# Patient Record
Sex: Female | Born: 1960
Health system: Southern US, Community
[De-identification: ages and names within clinical notes are randomized; demographics above are authoritative.]

## PROBLEM LIST (undated history)

## (undated) DIAGNOSIS — B009 Herpesviral infection, unspecified: Secondary | ICD-10-CM

## (undated) DIAGNOSIS — R7989 Other specified abnormal findings of blood chemistry: Secondary | ICD-10-CM

## (undated) DIAGNOSIS — D369 Benign neoplasm, unspecified site: Secondary | ICD-10-CM

## (undated) DIAGNOSIS — E229 Hyperfunction of pituitary gland, unspecified: Secondary | ICD-10-CM

## (undated) DIAGNOSIS — I10 Essential (primary) hypertension: Secondary | ICD-10-CM

## (undated) DIAGNOSIS — D352 Benign neoplasm of pituitary gland: Secondary | ICD-10-CM

## (undated) HISTORY — DX: Other specified abnormal findings of blood chemistry: R79.89

## (undated) HISTORY — DX: Essential (primary) hypertension: I10

## (undated) HISTORY — DX: Benign neoplasm, unspecified site: D36.9

## (undated) HISTORY — DX: Herpesviral infection, unspecified: B00.9

## (undated) HISTORY — PX: TUBAL LIGATION: SHX77

## (undated) HISTORY — DX: Benign neoplasm of pituitary gland: D35.2

## (undated) HISTORY — DX: Hyperfunction of pituitary gland, unspecified: E22.9

---

## 2017-02-28 DIAGNOSIS — Z23 Encounter for immunization: Secondary | ICD-10-CM | POA: Diagnosis not present

## 2017-04-18 DIAGNOSIS — Z136 Encounter for screening for cardiovascular disorders: Secondary | ICD-10-CM | POA: Diagnosis not present

## 2017-04-18 DIAGNOSIS — Z0000 Encounter for general adult medical examination without abnormal findings: Secondary | ICD-10-CM | POA: Diagnosis not present

## 2017-04-18 DIAGNOSIS — E229 Hyperfunction of pituitary gland, unspecified: Secondary | ICD-10-CM | POA: Diagnosis not present

## 2017-04-18 DIAGNOSIS — Z1389 Encounter for screening for other disorder: Secondary | ICD-10-CM | POA: Diagnosis not present

## 2017-06-01 DIAGNOSIS — Z1211 Encounter for screening for malignant neoplasm of colon: Secondary | ICD-10-CM | POA: Diagnosis not present

## 2017-06-01 DIAGNOSIS — Z01818 Encounter for other preprocedural examination: Secondary | ICD-10-CM | POA: Diagnosis not present

## 2017-06-26 DIAGNOSIS — E876 Hypokalemia: Secondary | ICD-10-CM | POA: Diagnosis not present

## 2017-06-26 DIAGNOSIS — I10 Essential (primary) hypertension: Secondary | ICD-10-CM | POA: Diagnosis not present

## 2017-07-12 DIAGNOSIS — I10 Essential (primary) hypertension: Secondary | ICD-10-CM | POA: Diagnosis not present

## 2017-07-12 DIAGNOSIS — R000 Tachycardia, unspecified: Secondary | ICD-10-CM | POA: Diagnosis not present

## 2017-07-25 DIAGNOSIS — R000 Tachycardia, unspecified: Secondary | ICD-10-CM | POA: Diagnosis not present

## 2017-07-25 DIAGNOSIS — I10 Essential (primary) hypertension: Secondary | ICD-10-CM | POA: Diagnosis not present

## 2017-07-26 ENCOUNTER — Other Ambulatory Visit: Payer: Self-pay | Admitting: Internal Medicine

## 2017-07-26 DIAGNOSIS — R000 Tachycardia, unspecified: Secondary | ICD-10-CM

## 2017-07-26 DIAGNOSIS — I10 Essential (primary) hypertension: Secondary | ICD-10-CM

## 2017-08-09 ENCOUNTER — Other Ambulatory Visit: Payer: Self-pay

## 2017-08-09 ENCOUNTER — Ambulatory Visit (HOSPITAL_COMMUNITY): Payer: BLUE CROSS/BLUE SHIELD | Attending: Internal Medicine

## 2017-08-09 ENCOUNTER — Ambulatory Visit (INDEPENDENT_AMBULATORY_CARE_PROVIDER_SITE_OTHER): Payer: BLUE CROSS/BLUE SHIELD

## 2017-08-09 DIAGNOSIS — R000 Tachycardia, unspecified: Secondary | ICD-10-CM | POA: Diagnosis not present

## 2017-08-09 DIAGNOSIS — I10 Essential (primary) hypertension: Secondary | ICD-10-CM

## 2017-08-09 DIAGNOSIS — I5030 Unspecified diastolic (congestive) heart failure: Secondary | ICD-10-CM | POA: Diagnosis not present

## 2017-08-10 LAB — EXERCISE TOLERANCE TEST
Estimated workload: 11 METS
Exercise duration (min): 9 min
Exercise duration (sec): 33 s
MPHR: 162 {beats}/min
Peak HR: 164 {beats}/min
Percent HR: 101 %
RPE: 17
Rest HR: 73 {beats}/min

## 2017-08-24 DIAGNOSIS — I10 Essential (primary) hypertension: Secondary | ICD-10-CM | POA: Diagnosis not present

## 2017-08-24 DIAGNOSIS — R9439 Abnormal result of other cardiovascular function study: Secondary | ICD-10-CM | POA: Diagnosis not present

## 2017-08-24 DIAGNOSIS — I5189 Other ill-defined heart diseases: Secondary | ICD-10-CM | POA: Diagnosis not present

## 2017-09-14 ENCOUNTER — Telehealth: Payer: Self-pay | Admitting: Cardiovascular Disease

## 2017-09-14 ENCOUNTER — Encounter: Payer: Self-pay | Admitting: Cardiovascular Disease

## 2017-09-14 ENCOUNTER — Ambulatory Visit (INDEPENDENT_AMBULATORY_CARE_PROVIDER_SITE_OTHER): Payer: BLUE CROSS/BLUE SHIELD | Admitting: Cardiovascular Disease

## 2017-09-14 VITALS — BP 137/83 | HR 66 | Ht 60.0 in | Wt 149.0 lb

## 2017-09-14 DIAGNOSIS — R0789 Other chest pain: Secondary | ICD-10-CM

## 2017-09-14 DIAGNOSIS — R002 Palpitations: Secondary | ICD-10-CM

## 2017-09-14 DIAGNOSIS — I5189 Other ill-defined heart diseases: Secondary | ICD-10-CM | POA: Insufficient documentation

## 2017-09-14 DIAGNOSIS — I10 Essential (primary) hypertension: Secondary | ICD-10-CM | POA: Insufficient documentation

## 2017-09-14 DIAGNOSIS — E785 Hyperlipidemia, unspecified: Secondary | ICD-10-CM | POA: Insufficient documentation

## 2017-09-14 NOTE — Telephone Encounter (Signed)
New Message:       Pt calling to see if her medication list was sent over with her referral. If not she needs enough time to get it before her appt today

## 2017-09-14 NOTE — Telephone Encounter (Signed)
Informed pt that we have not yet received records from PCP. Pt states she will be calling office to have it faxed.

## 2017-09-14 NOTE — Assessment & Plan Note (Signed)
History of hyperlipidemia not on statin therapy. 

## 2017-09-14 NOTE — Progress Notes (Signed)
09/14/2017 Dawn Duffy   February 08, 1961  [id number]  Primary Physician Dawn Huddle, MD Primary Cardiologist: Lorretta Harp MD [name], [place]  HPI:  Dawn Duffy is a 57 y.o. 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 Inda Merlin for cardiac evaluation because of chest pain, palpitations and a recent GXT that was positive. Her only risk factor includes true hypertension which is fairly new over the last several months. She is under significant amount of stress at home however. She also has palpitations which began back in the first year along with occasional chest pressure and left upper extremity radiation. Recent 2-D echo showed normal LV function with grade 1 diastolic dysfunction GXT showed 1-2 mm of lateral ST segment depression.  Current Meds  Medication Sig  . Cetirizine HCl (ZYRTEC ALLERGY) 10 MG CAPS Take 1 tablet by mouth as directed.  . Cholecalciferol (VITAMIN D3) 1000 units CAPS Take 1 capsule by mouth daily.  . metoprolol succinate (TOPROL-XL) 50 MG 24 hr tablet Take 50 mg by mouth daily.  Marland Kitchen triamterene-hydrochlorothiazide (MAXZIDE-25) 37.5-25 MG tablet Take by mouth daily. Pt takes a half tablet  . valACYclovir (VALTREX) 1000 MG tablet Take 1 tablet by mouth as directed.     Allergies  Allergen Reactions  . Amoxicillin-Pot Clavulanate Nausea Only  . Phenothiazines   . Sulfa Antibiotics     Social History   Socioeconomic History  . Marital status: Married    Spouse name: Not on file  . Number of children: Not on file  . Years of education: Not on file  . Highest education level: Not on file  Occupational History  . Not on file  Social Needs  . Financial resource strain: Not on file  . Food insecurity:    Worry: Not on file    Inability: Not on file  . Transportation needs:    Medical: Not on file   Non-medical: Not on file  Tobacco Use  . Smoking status: Never Smoker  . Smokeless tobacco: Never Used  Substance and Sexual Activity  . Alcohol use: Not on file  . Drug use: Not on file  . Sexual activity: Not on file  Lifestyle  . Physical activity:    Days per week: Not on file    Minutes per session: Not on file  . Stress: Not on file  Relationships  . Social connections:    Talks on phone: Not on file    Gets together: Not on file    Attends religious service: Not on file    Active member of club or organization: Not on file    Attends meetings of clubs or organizations: Not on file    Relationship status: Not on file  . Intimate partner violence:    Fear of current or ex partner: Not on file    Emotionally abused: Not on file    Physically abused: Not on file    Forced sexual activity: Not on file  Other Topics Concern  . Not on file  Social History Narrative  . Not on file     Review of Systems: General: negative for chills, fever, night sweats or weight changes.  Cardiovascular: negative for chest pain, dyspnea on exertion, edema, orthopnea, palpitations, paroxysmal nocturnal dyspnea or shortness of breath Dermatological: negative for rash Respiratory: negative for cough or wheezing Urologic: negative for hematuria  Abdominal: negative for nausea, vomiting, diarrhea, bright red blood per rectum, melena, or hematemesis Neurologic: negative for visual changes, syncope, or dizziness All other systems reviewed and are otherwise negative except as noted above.    Blood pressure 137/83, pulse 66, height 5' (1.524 m), weight 149 lb (67.6 kg), SpO2 98 %.  General appearance: alert and no distress Neck: no adenopathy, no carotid bruit, no JVD, supple, symmetrical, trachea midline and thyroid not enlarged, symmetric, no tenderness/mass/nodules Lungs: clear to auscultation bilaterally Heart: regular rate and rhythm, S1, S2 normal, no murmur, click, rub or  gallop Extremities: extremities normal, atraumatic, no cyanosis or edema Pulses: 2+ and symmetric Skin: Skin color, texture, turgor normal. No rashes or lesions Neurologic: Alert and oriented X 3, normal strength and tone. Normal symmetric reflexes. Normal coordination and gait  EKG not performed today  ASSESSMENT AND PLAN:   Essential hypertension History of essential hypertension blood pressure measured at 137/83. She is on Maxzide and metoprolol.  Hyperlipidemia History of hyperlipidemia not on statin therapy  Palpitations History palpitations or last several months occurring on a weekly basis. We will check a 2 week event monitor.  Atypical chest pain History of atypical chest pain with occasional left upper extremity. She had a recent GXT that showed 1-2 mm of lateral ST segment depression.I'm going to get a coronary CTA to rule out CAD given the fact that this could be a falsely positive test.      Lorretta Harp MD [facility], [facility] 09/14/2017 4:45 PM

## 2017-09-14 NOTE — Patient Instructions (Signed)
Medication Instructions: Your physician recommends that you continue on your current medications as directed. Please refer to the Current Medication list given to you today.  Labwork: Your physician recommends that you return for lab work within 1 week prior to CTA.   Testing/Procedures:  Your physician has recommended that you wear a 14 day event monitor. Event monitors are medical devices that record the heart's electrical activity. Doctors most often [place] these monitors to diagnose arrhythmias. Arrhythmias are problems with the speed or rhythm of the heartbeat. The monitor is a small, portable device. You can wear one while you do your normal daily activities. This is usually used to diagnose what is causing palpitations/syncope (passing out).   Coronary CTA:   Please arrive at the [facility] main entrance of [facility] at                AM (30-45 minutes prior to test start time)  [facility] [place], [place] [address] [phone]  Proceed to the [facility] Radiology Department (First Floor).  Please follow these instructions carefully (unless otherwise directed):  Hold all erectile dysfunction medications at least 48 hours prior to test.  On the Night Before the Test: . Drink plenty of water. . Do not consume any caffeinated/decaffeinated beverages or chocolate 12 hours prior to your test. . Do not take any antihistamines 12 hours prior to your test.  On the Day of the Test: . Drink plenty of water. Do not drink any water within one hour of the test. . Do not eat any food 4 hours prior to the test. . You may take your regular medications prior to the test. .  After the Test: . Drink plenty of water. . After receiving IV contrast, you may experience a mild flushed feeling. This is normal. . On occasion, you may experience a mild rash up to 24 hours after the test. This is not dangerous. If this occurs, you can take Benadryl 25 mg and  increase your fluid intake. . If you experience trouble breathing, this can be serious. If it is severe call 911 IMMEDIATELY. If it is mild, please call our office. . If you take any of these medications: Glipizide/Metformin, Avandament, Glucavance, please do not take 48 hours after completing test.   Follow-Up: Your physician recommends that you schedule a follow-up appointment with Dr. Gwenlyn Found after testing.

## 2017-09-14 NOTE — Assessment & Plan Note (Signed)
History of essential hypertension blood pressure measured at 137/83. She is on Maxzide and metoprolol.

## 2017-09-14 NOTE — Assessment & Plan Note (Signed)
History palpitations or last several months occurring on a weekly basis. We will check a 2 week event monitor.

## 2017-09-14 NOTE — Assessment & Plan Note (Signed)
History of atypical chest pain with occasional left upper extremity. She had a recent GXT that showed 1-2 mm of lateral ST segment depression.I'm going to get a coronary CTA to rule out CAD given the fact that this could be a falsely positive test.

## 2017-09-19 ENCOUNTER — Ambulatory Visit: Payer: BLUE CROSS/BLUE SHIELD | Admitting: Cardiovascular Disease

## 2017-11-30 DIAGNOSIS — L089 Local infection of the skin and subcutaneous tissue, unspecified: Secondary | ICD-10-CM | POA: Diagnosis not present

## 2017-11-30 DIAGNOSIS — S60429A Blister (nonthermal) of unspecified finger, initial encounter: Secondary | ICD-10-CM | POA: Diagnosis not present

## 2017-11-30 DIAGNOSIS — B001 Herpesviral vesicular dermatitis: Secondary | ICD-10-CM | POA: Diagnosis not present

## 2017-12-15 ENCOUNTER — Encounter: Payer: Self-pay | Admitting: Obstetrics and Gynecology

## 2017-12-15 DIAGNOSIS — Z1231 Encounter for screening mammogram for malignant neoplasm of breast: Secondary | ICD-10-CM | POA: Diagnosis not present

## 2018-01-05 ENCOUNTER — Encounter: Payer: Self-pay | Admitting: Obstetrics and Gynecology

## 2018-01-10 ENCOUNTER — Ambulatory Visit (HOSPITAL_COMMUNITY): Payer: BLUE CROSS/BLUE SHIELD

## 2018-01-30 ENCOUNTER — Ambulatory Visit (HOSPITAL_COMMUNITY): Payer: BLUE CROSS/BLUE SHIELD

## 2018-02-20 ENCOUNTER — Encounter: Payer: BLUE CROSS/BLUE SHIELD | Admitting: *Deleted

## 2018-02-20 ENCOUNTER — Telehealth: Payer: Self-pay | Admitting: *Deleted

## 2018-02-20 ENCOUNTER — Ambulatory Visit (HOSPITAL_COMMUNITY)
Admission: RE | Admit: 2018-02-20 | Discharge: 2018-02-20 | Disposition: A | Discharge status: Home / Self Care | Payer: BLUE CROSS/BLUE SHIELD | Source: Ambulatory Visit | Attending: Cardiovascular Disease | Admitting: Cardiovascular Disease

## 2018-02-20 DIAGNOSIS — Z006 Encounter for examination for normal comparison and control in clinical research program: Secondary | ICD-10-CM

## 2018-02-20 DIAGNOSIS — R0789 Other chest pain: Secondary | ICD-10-CM | POA: Insufficient documentation

## 2018-02-20 DIAGNOSIS — R002 Palpitations: Secondary | ICD-10-CM | POA: Insufficient documentation

## 2018-02-20 DIAGNOSIS — R079 Chest pain, unspecified: Secondary | ICD-10-CM | POA: Diagnosis not present

## 2018-02-20 MED ORDER — METOPROLOL TARTRATE 5 MG/5ML IV SOLN
INTRAVENOUS | Status: AC
  Filled 2018-02-20: qty 15

## 2018-02-20 MED ORDER — IOPAMIDOL (ISOVUE-370) INJECTION 76%
100.0000 mL | Freq: Once | INTRAVENOUS | Status: AC | PRN
  Administered 2018-02-20: 80 mL via INTRAVENOUS

## 2018-02-20 MED ORDER — METOPROLOL TARTRATE 5 MG/5ML IV SOLN
5.0000 mg | INTRAVENOUS | Status: DC | PRN
  Administered 2018-02-20 (×3): 5 mg via INTRAVENOUS
  Filled 2018-02-20: qty 5

## 2018-02-20 MED ORDER — NITROGLYCERIN 0.4 MG SL SUBL
0.8000 mg | SUBLINGUAL_TABLET | Freq: Once | SUBLINGUAL | Status: AC
  Administered 2018-02-20: 0.8 mg via SUBLINGUAL
  Filled 2018-02-20: qty 25

## 2018-02-20 MED ORDER — NITROGLYCERIN 0.4 MG SL SUBL
SUBLINGUAL_TABLET | SUBLINGUAL | Status: AC
  Filled 2018-02-20: qty 2

## 2018-02-20 NOTE — Progress Notes (Signed)
CT scan completed. Tolerated well. D/C home walking, awake and alert. In no distress. 

## 2018-02-20 NOTE — Telephone Encounter (Signed)
Advised patient of CT results   Patient would like to hold off on her monitor for now stated she is doing better. Her episodes of feeling like her heart is racing much better and not even having them monthly. States she notices when she has a lot of salt or gets anxious. Advised ok to hold off for now since they are so infrequent but to call back if episodes increase. Advised would make Dr Gwenlyn Found aware and call back if further recommendations.

## 2018-02-20 NOTE — Telephone Encounter (Signed)
-----   Message from Lorretta Harp, MD sent at 02/20/2018  1:36 PM EDT ----- Coronary calcium score of 0.  Normal coronary CTA.  No evidence of CAD.

## 2018-02-20 NOTE — Telephone Encounter (Signed)
-----   Message from Lorretta Harp, MD sent at 02/20/2018  1:28 PM EDT ----- No significant extracardiac abnormality on chest CT

## 2018-02-20 NOTE — Research (Signed)
CADFEM Informed Consent           Subject Name:  Dawn Duffy   Subject met inclusion and exclusion criteria.  The informed consent form, study requirements and expectations were reviewed with the subject and questions and concerns were addressed prior to the signing of the consent form.  The subject verbalized understanding of the trial requirements.  The subject agreed to participate in the CADFEM trial and signed the informed consent.  The informed consent was obtained prior to performance of any protocol-specific procedures for the subject.  A copy of the signed informed consent was given to the subject and a copy was placed in the subject's medical record.   [place] Chalmers, Research Assistant 02/20/2018 7:50

## 2018-02-21 NOTE — Research (Signed)
CadFem visit completed.

## 2018-03-05 ENCOUNTER — Other Ambulatory Visit (HOSPITAL_COMMUNITY)
Admission: RE | Admit: 2018-03-05 | Discharge: 2018-03-05 | Disposition: A | Discharge status: Home / Self Care | Payer: BLUE CROSS/BLUE SHIELD | Source: Ambulatory Visit | Attending: Obstetrics and Gynecology | Admitting: Obstetrics and Gynecology

## 2018-03-05 ENCOUNTER — Encounter: Payer: Self-pay | Admitting: Obstetrics and Gynecology

## 2018-03-05 ENCOUNTER — Ambulatory Visit: Payer: BLUE CROSS/BLUE SHIELD | Admitting: Obstetrics and Gynecology

## 2018-03-05 ENCOUNTER — Other Ambulatory Visit: Payer: Self-pay

## 2018-03-05 VITALS — BP 130/78 | HR 72 | Ht 60.63 in | Wt 153.6 lb

## 2018-03-05 DIAGNOSIS — Z01419 Encounter for gynecological examination (general) (routine) without abnormal findings: Secondary | ICD-10-CM

## 2018-03-05 DIAGNOSIS — D352 Benign neoplasm of pituitary gland: Secondary | ICD-10-CM | POA: Diagnosis not present

## 2018-03-05 MED ORDER — VALACYCLOVIR HCL 1 G PO TABS: ORAL_TABLET | ORAL | 3 refills | Status: DC

## 2018-03-05 NOTE — Patient Instructions (Signed)

## 2018-03-05 NOTE — Progress Notes (Signed)
57 y.o. G56P2001 Married Caucasian female here for annual exam.    Lots of transition with moves due to her husband's change in work.   PCP is doing blood work and following her prolactin level.  It was normal with the last check.  States it has been normal for a really long time.  It was elevated during her fertility time.  Had an MRI and has a microadenoma.  She will do her prolactin levels here.   Wants a refill of a cream for anal itching.  She does not remember the name and will call back with this information.   Works for [employer].   PCP:   Josetta Huddle, M.D.  No LMP recorded. Patient is postmenopausal.           Sexually active: Yes.    The current method of family planning is post menopausal status.    Exercising: Yes.    walking Smoker:  no  Health Maintenance: Pap:  2016 normal per patient History of abnormal Pap:  no MMG:  01/2018 normal per patient, done at [facility] BMD:  Many years ago and was normal.  Colonoscopy:  05/2005 WNL.  Had an appt and had to cancel.  She will reschedule with Dr. Watt Climes.  TDaP:  02/18/2010 HIV: Never Hep C: Never Screening Labs: does with PCP   reports that she has never smoked. She has never used smokeless tobacco. She reports that she drinks about 6.0 standard drinks of alcohol per week. She reports that she does not use drugs.  Past Medical History:  Diagnosis Date  . Elevated prolactin level ([place])   . HSV-1 infection   . Hypertension   . Microadenoma [facility])     Past Surgical History:  Procedure Laterality Date  . TUBAL LIGATION      Current Outpatient Medications  Medication Sig Dispense Refill  . Cetirizine HCl (ZYRTEC ALLERGY) 10 MG CAPS Take 1 tablet by mouth as directed.    . Cholecalciferol (VITAMIN D3) 1000 units CAPS Take 1 capsule by mouth daily.    . metoprolol succinate (TOPROL-XL) 50 MG 24 hr tablet Take 50 mg by mouth daily.  3  . triamterene-hydrochlorothiazide (MAXZIDE-25) 37.5-25 MG tablet Take by  mouth daily. Pt takes a half tablet  3  . valACYclovir (VALTREX) 1000 MG tablet Take 1 tablet by mouth as directed.     No current facility-administered medications for this visit.     Family History  Problem Relation Age of Onset  . Stroke Mother   . Prostate cancer Father   . Diabetes Sister   . Kidney cancer Paternal Grandmother     Review of Systems  Constitutional: Negative.   HENT: Negative.   Eyes: Negative.   Respiratory: Negative.   Cardiovascular: Negative.   Gastrointestinal: Negative.   Endocrine: Negative.   Genitourinary: Negative.   Musculoskeletal: Negative.   Skin: Negative.   Allergic/Immunologic: Negative.   Neurological: Negative.   Hematological: Negative.   Psychiatric/Behavioral: Negative.     Exam:   BP 130/78 (BP Location: Right Arm, Patient Position: Sitting, Cuff Size: Normal)   Pulse 72   Ht 5' 0.63" (1.54 m)   Wt 153 lb 9.6 oz (69.7 kg)   BMI 29.38 kg/m     General appearance: alert, cooperative and appears stated age Head: Normocephalic, without obvious abnormality, atraumatic Neck: no adenopathy, supple, symmetrical, trachea midline and thyroid normal to inspection and palpation Lungs: clear to auscultation bilaterally Breasts: normal appearance, no masses or tenderness,  No nipple retraction or dimpling, No nipple discharge or bleeding, No axillary or supraclavicular adenopathy Heart: regular rate and rhythm Abdomen: soft, non-tender; no masses, no organomegaly Extremities: extremities normal, atraumatic, no cyanosis or edema Skin: Skin color, texture, turgor normal. No rashes or lesions Lymph nodes: Cervical, supraclavicular, and axillary nodes normal. No abnormal inguinal nodes palpated Neurologic: Grossly normal  Pelvic: External genitalia:  no lesions              Urethra:  normal appearing urethra with no masses, tenderness or lesions              Bartholins and Skenes: normal                 Vagina: normal appearing vagina  with normal color and discharge, no lesions              Cervix: no lesions              Pap taken: Yes.   Bimanual Exam:  Uterus:  normal size, contour, position, consistency, mobility, non-tender              Adnexa: no mass, fullness, tenderness              Rectal exam: Yes.  .  Confirms.              Anus:  normal sphincter tone, no lesions  Chaperone was present for exam.  Assessment:   Well woman visit with normal exam. Elevated prolactin and microadenoma.  HTN.   Plan: Mammogram screening. Recommended self breast awareness. Pap and HR HPV as above. Guidelines for Calcium, Vitamin D, regular exercise program including cardiovascular and weight bearing exercise. Prolactin level yearly.  Follow up annually and prn.    After visit summary provided.

## 2018-03-06 ENCOUNTER — Telehealth: Payer: Self-pay | Admitting: Obstetrics and Gynecology

## 2018-03-06 LAB — PROLACTIN: Prolactin: 6.2 ng/mL (ref 4.8–23.3)

## 2018-03-06 MED ORDER — CLOTRIMAZOLE-BETAMETHASONE 1-0.05 % EX CREA: 1.0000 "application " | TOPICAL_CREAM | Freq: Every day | CUTANEOUS | 0 refills | Status: DC | PRN

## 2018-03-06 NOTE — Telephone Encounter (Signed)
Rx to pharmacy, patient notified. Encounter closed.

## 2018-03-06 NOTE — Telephone Encounter (Signed)
[place] for refill one tube.

## 2018-03-06 NOTE — Telephone Encounter (Signed)
Patient stated she was seen yesterday and told to call with the name of a new medication she'd like to take: Clotrimazole and betamethasone ditropionate cream 1%/0.05%.  [facility] on [address] and [address]

## 2018-03-06 NOTE — Telephone Encounter (Signed)
Call returned to patient. Confirmed medication, patient states she applies topically daily PRN. States she discussed with Dr. Quincy Simmonds at [facility] 03/05/18. Advised will review with Dr. Quincy Simmonds and return call. Pharmacy confirmed.   Rx pended for Lotrisone cream.   Routing to Dr. Quincy Simmonds

## 2018-03-07 ENCOUNTER — Telehealth: Payer: Self-pay | Admitting: *Deleted

## 2018-03-07 LAB — CYTOLOGY - PAP
Diagnosis: NEGATIVE
HPV: NOT DETECTED

## 2018-03-07 NOTE — Telephone Encounter (Signed)
Message left to return call to Dawn Duffy at [phone].    

## 2018-03-07 NOTE — Telephone Encounter (Signed)
Patient returned call. Notified of results and verbalized understanding.   Encounter closed.

## 2018-03-07 NOTE — Telephone Encounter (Signed)
-----   Message from Nunzio Cobbs, MD sent at 03/06/2018  3:01 PM EDT ----- Please contact patient with results of prolactin which is normal.  This can be tested here yearly.   I am highlighting this result so you know to contact the patient.

## 2018-03-09 DIAGNOSIS — Z23 Encounter for immunization: Secondary | ICD-10-CM | POA: Diagnosis not present

## 2018-03-23 ENCOUNTER — Encounter: Payer: Self-pay | Admitting: Cardiovascular Disease

## 2018-04-27 DIAGNOSIS — M25571 Pain in right ankle and joints of right foot: Secondary | ICD-10-CM | POA: Diagnosis not present

## 2018-06-29 DIAGNOSIS — B078 Other viral warts: Secondary | ICD-10-CM | POA: Diagnosis not present

## 2018-06-29 DIAGNOSIS — L400 Psoriasis vulgaris: Secondary | ICD-10-CM | POA: Diagnosis not present

## 2018-06-29 DIAGNOSIS — D2271 Melanocytic nevi of right lower limb, including hip: Secondary | ICD-10-CM | POA: Diagnosis not present

## 2018-06-29 DIAGNOSIS — D2261 Melanocytic nevi of right upper limb, including shoulder: Secondary | ICD-10-CM | POA: Diagnosis not present

## 2018-06-29 DIAGNOSIS — D225 Melanocytic nevi of trunk: Secondary | ICD-10-CM | POA: Diagnosis not present

## 2018-06-29 DIAGNOSIS — D485 Neoplasm of uncertain behavior of skin: Secondary | ICD-10-CM | POA: Diagnosis not present

## 2018-07-02 DIAGNOSIS — D2239 Melanocytic nevi of other parts of face: Secondary | ICD-10-CM | POA: Diagnosis not present

## 2018-08-23 ENCOUNTER — Telehealth: Payer: Self-pay | Admitting: Obstetrics and Gynecology

## 2018-08-23 NOTE — Telephone Encounter (Signed)
Patient says she is leaking urine.

## 2018-08-23 NOTE — Telephone Encounter (Signed)
I would be fine with her coming in. I think you should contact Dr Quincy Simmonds to see if she wants to talk to her or have her come in. She could try OTC AZO to determine if it really is urine she is leaking.

## 2018-08-23 NOTE — Telephone Encounter (Signed)
Spoke with patient. Patient states that over the last few days sh has noticed that her underwear is intermittently wet. Does not feel this is discharge. Feels she is completely emptying her bladder, but has pressure after using the restroom intermittently. Denies any pain with using the restroom. Over the last few days she has had leakage after emptying her bladder. This is better when laying down. Patient is concerned with symptoms and with coming to the office during [place] state of emergency. Advised patient symptoms may be related to bladder prolapse. Patient wants me to review scheduling and return call.  Dr.Jertson, okay to schedule with Dr.Silva in office?

## 2018-08-23 NOTE — Telephone Encounter (Signed)
Patients symptoms may be due to bladder infection.  I am happy to see her tomorrow for a brief visit and a urine check for infection.  It would be better for her not to take the AZO before being seen.  If she wishes to avoid an office visit, then take the AZO to see if she is leaking urine.   If develops urgency, pain with urination, back pain, fever, or nausea, likely needs evaluation for urinary tract infection.

## 2018-08-23 NOTE — Telephone Encounter (Signed)
Spoke with patient. Advised of message as seen below from [place]. Patient verbalizes understanding. States that she is unable to go out today for AZO but will tomorrow. Advised will review with Dr.Silva and she will receive a return call with further recommendations.

## 2018-08-23 NOTE — Telephone Encounter (Signed)
Spoke with patient. Appointment scheduled for 4/10 at 1 pm with Dr.Silva. Patient declines morning appointment. Advised if she develops any urgency, pain with urination, back pain, fever, or nausea needs to be seen for evaluation of UTI with urgent care before tomorrow. Encounter closed.

## 2018-08-24 ENCOUNTER — Ambulatory Visit: Payer: BLUE CROSS/BLUE SHIELD | Admitting: Obstetrics and Gynecology

## 2018-08-24 ENCOUNTER — Other Ambulatory Visit: Payer: Self-pay

## 2018-08-24 ENCOUNTER — Encounter (INDEPENDENT_AMBULATORY_CARE_PROVIDER_SITE_OTHER): Payer: Self-pay

## 2018-08-24 ENCOUNTER — Encounter: Payer: Self-pay | Admitting: Obstetrics and Gynecology

## 2018-08-24 VITALS — BP 120/70 | HR 68 | Temp 97.4°F | Resp 16 | Wt 157.0 lb

## 2018-08-24 DIAGNOSIS — R32 Unspecified urinary incontinence: Secondary | ICD-10-CM

## 2018-08-24 DIAGNOSIS — N3281 Overactive bladder: Secondary | ICD-10-CM

## 2018-08-24 LAB — POCT URINALYSIS DIPSTICK
Bilirubin, UA: NEGATIVE
Blood, UA: NEGATIVE
Glucose, UA: NEGATIVE
Ketones, UA: NEGATIVE
Leukocytes, UA: NEGATIVE
Nitrite, UA: NEGATIVE
Protein, UA: NEGATIVE
Urobilinogen, UA: NEGATIVE E.U./dL — AB
pH, UA: 5 (ref 5.0–8.0)

## 2018-08-24 NOTE — Patient Instructions (Signed)

## 2018-08-24 NOTE — Progress Notes (Signed)
GYNECOLOGY  VISIT   HPI: 58 y.o.   Married  Caucasian  female   G2P2001 with No LMP recorded. Patient is postmenopausal.   here for urinary incontinence.   Thinks she is leaking urine.  Symptoms started 6 days ago, but thinks it is more prominent just recently.  She is not sure if it is vaginal discharge or not.   She does have a history of urgency, but this feels different.  (She reports key in lock syndrome.) She is feeling pressure in her pelvis, but not for the last day or so.  Denies dysuria or blood in the urine.  Now feels like she may not be finishing her voiding completely.  She is double voiding.   She is now wearing Poise pads and cannot tell what is going on.   No leak with cough, laugh or sneeze.   DF - every hour.  NF - once per night. No enuresis.   No vaginal itching, burning, or odor.  No change in how sex feels per patient.  No change in partner.   Drinks tea during the day.  One to two per day.   Takes Maxide for 1.5 years.   GYNECOLOGIC HISTORY: No LMP recorded. Patient is postmenopausal. Contraception:  Post menopausal Menopausal hormone therapy:  none Last mammogram:  9/19 neg per patient Last pap smear:   03-05-18 neg HPV HR neg poct urine-neg        OB History    Gravida  2   Para  2   Term  2   Preterm  0   AB  0   Living  1     SAB      TAB      Ectopic      Multiple      Live Births  1              Patient Active Problem List   Diagnosis Date Noted  . Essential hypertension 09/14/2017  . Palpitations 09/14/2017  . Atypical chest pain 09/14/2017  . Diastolic dysfunction 54/56/2563  . Hyperlipidemia 09/14/2017    Past Medical History:  Diagnosis Date  . Elevated prolactin level ([place])   . HSV-1 infection   . Hypertension   . Microadenoma [facility])     Past Surgical History:  Procedure Laterality Date  . CESAREAN SECTION     x 2.   . TUBAL LIGATION      Current Outpatient Medications  Medication Sig  Dispense Refill  . Cetirizine HCl (ZYRTEC ALLERGY) 10 MG CAPS Take 1 tablet by mouth as directed.    . Cholecalciferol (VITAMIN D3) 1000 units CAPS Take 1 capsule by mouth daily.    . metoprolol succinate (TOPROL-XL) 50 MG 24 hr tablet Take 50 mg by mouth daily.  3  . triamterene-hydrochlorothiazide (MAXZIDE-25) 37.5-25 MG tablet Take by mouth daily. Pt takes a half tablet  3  . valACYclovir (VALTREX) 1000 MG tablet Take 2 tablets (2000 mg) by mouth every 12 hours for 24 hours. 20 tablet 3   No current facility-administered medications for this visit.      ALLERGIES: Amoxicillin-pot clavulanate; Phenothiazines; and Sulfa antibiotics  Family History  Problem Relation Age of Onset  . Stroke Mother   . Prostate cancer Father   . Diabetes Sister   . Kidney cancer Paternal Grandmother     Social History   Socioeconomic History  . Marital status: Married    Spouse name: Not on file  . Number  of children: Not on file  . Years of education: Not on file  . Highest education level: Not on file  Occupational History  . Not on file  Social Needs  . Financial resource strain: Not on file  . Food insecurity:    Worry: Not on file    Inability: Not on file  . Transportation needs:    Medical: Not on file    Non-medical: Not on file  Tobacco Use  . Smoking status: Never Smoker  . Smokeless tobacco: Never Used  Substance and Sexual Activity  . Alcohol use: Yes    Alcohol/week: 6.0 standard drinks    Types: 6 Glasses of wine per week  . Drug use: Never  . Sexual activity: Yes    Birth control/protection: Post-menopausal, Surgical    Comment: tubal ligation  Lifestyle  . Physical activity:    Days per week: Not on file    Minutes per session: Not on file  . Stress: Not on file  Relationships  . Social connections:    Talks on phone: Not on file    Gets together: Not on file    Attends religious service: Not on file    Active member of club or organization: Not on file     Attends meetings of clubs or organizations: Not on file    Relationship status: Not on file  . Intimate partner violence:    Fear of current or ex partner: Not on file    Emotionally abused: Not on file    Physically abused: Not on file    Forced sexual activity: Not on file  Other Topics Concern  . Not on file  Social History Narrative  . Not on file    Review of Systems  Constitutional: Negative.   HENT: Negative.   Eyes: Negative.   Respiratory: Negative.   Cardiovascular: Negative.   Gastrointestinal: Negative.   Endocrine: Negative.   Genitourinary:       Urinary incontinence  Musculoskeletal: Negative.   Skin: Negative.   Allergic/Immunologic: Negative.   Neurological: Negative.   Hematological: Negative.   Psychiatric/Behavioral: Negative.     PHYSICAL EXAMINATION:    BP 120/70   Pulse 68   Temp (!) 97.4 F (36.3 C) (Oral)   Resp 16   Wt 157 lb (71.2 kg)   BMI 30.03 kg/m     General appearance: alert, cooperative and appears stated age   Pelvic: External genitalia:  no lesions              Urethra:  normal appearing urethra with no masses, tenderness or lesions              Bartholins and Skenes: normal                 Vagina: normal appearing vagina with normal color and discharge, no lesions              Cervix: no lesions                Bimanual Exam:  Uterus:  normal size, contour, position, consistency, mobility, non-tender              Adnexa: no mass, fullness, tenderness              Rectal exam: Yes.  .  Confirms.              Anus:  normal sphincter tone, no lesions  Chaperone was present for exam.  ASSESSMENT  Overactive bladder.  Pelvic pressure.  No UTI. Hx C/S x 2.   PLAN  We discussed overactive bladder.  She will reduce/eliminate bladder irritants.  She will take AZO to see if she is leaking or not.  If urinary incontinence is confirmed, she will return for a potential Rx for anticholinergic/antimuscarinic.  If pelvic  pressure persists, pelvic US.   An After Visit Summary was printed and given to the patient.  _25_____ minutes face to face time of which over 50% was spent in counseling.

## 2018-11-12 ENCOUNTER — Other Ambulatory Visit: Payer: Self-pay

## 2018-11-12 ENCOUNTER — Telehealth: Payer: Self-pay | Admitting: Obstetrics and Gynecology

## 2018-11-12 ENCOUNTER — Encounter: Payer: Self-pay | Admitting: Obstetrics and Gynecology

## 2018-11-12 ENCOUNTER — Ambulatory Visit: Payer: BC Managed Care – PPO | Admitting: Obstetrics and Gynecology

## 2018-11-12 VITALS — BP 122/72 | HR 76 | Temp 97.5°F | Resp 12 | Ht 60.0 in | Wt 157.0 lb

## 2018-11-12 DIAGNOSIS — N3281 Overactive bladder: Secondary | ICD-10-CM

## 2018-11-12 DIAGNOSIS — R3989 Other symptoms and signs involving the genitourinary system: Secondary | ICD-10-CM

## 2018-11-12 MED ORDER — OXYBUTYNIN CHLORIDE ER 5 MG PO TB24: 5.0000 mg | ORAL_TABLET | Freq: Every day | ORAL | 1 refills | Status: DC

## 2018-11-12 MED ORDER — NITROFURANTOIN MONOHYD MACRO 100 MG PO CAPS: 100.0000 mg | ORAL_CAPSULE | Freq: Two times a day (BID) | ORAL | 0 refills | Status: DC

## 2018-11-12 NOTE — Telephone Encounter (Signed)
Call reviewed with Dr. Quincy Simmonds. Call returned to patient. Advised appt for today has been r/s to earlier time, 3pm. Please return call to office to confirm message received.

## 2018-11-12 NOTE — Telephone Encounter (Signed)
Patient returned call. Patient states she can not come into office any earlier than appt previously scheduled, requesting to keep OV for 4:30pm. Will keep OV as scheduled for 4:30pm with Dr. Quincy Simmonds.  Routing to provider FYI.

## 2018-11-12 NOTE — Progress Notes (Signed)
GYNECOLOGY  VISIT   HPI: 58 y.o.   Married  Caucasian  female   G2P2001 with No LMP recorded. Patient is postmenopausal.   here for OAB and bladder pressure.  Has urinary incontinence.  Wearing Poise and the pad and her underwear is wet.   For the last 3 days is not feeling well.  Has some pressure after voiding.  Not sleeping well.  When she is lying down, it feels better. She is more wet with incontinence with sitting.   Stands and squats to complete her voiding.   No blood in urine.  No nausea or vomiting.  No fever.   No real burning with urination.   She states she is sensitive to medication.   No hx glaucoma.   Patient voided 179mL  Cath for 36 cc.   Urine dip - 1+ RBC.   GYNECOLOGIC HISTORY: No LMP recorded. Patient is postmenopausal. Contraception:  Postmenopausal Menopausal hormone therapy:  none Last mammogram:  9/19 neg per patient Last pap smear:   03-05-18 neg HPV HR neg        OB History    Gravida  2   Para  2   Term  2   Preterm  0   AB  0   Living  1     SAB      TAB      Ectopic      Multiple      Live Births  1              Patient Active Problem List   Diagnosis Date Noted  . Essential hypertension 09/14/2017  . Palpitations 09/14/2017  . Atypical chest pain 09/14/2017  . Diastolic dysfunction 16/02/9603  . Hyperlipidemia 09/14/2017    Past Medical History:  Diagnosis Date  . Elevated prolactin level ([place])   . HSV-1 infection   . Hypertension   . Microadenoma [facility])     Past Surgical History:  Procedure Laterality Date  . CESAREAN SECTION     x 2.   . TUBAL LIGATION      Current Outpatient Medications  Medication Sig Dispense Refill  . Cetirizine HCl (ZYRTEC ALLERGY) 10 MG CAPS Take 1 tablet by mouth as directed.    . Cholecalciferol (VITAMIN D3) 1000 units CAPS Take 1 capsule by mouth daily.    . metoprolol succinate (TOPROL-XL) 50 MG 24 hr tablet Take 50 mg by mouth daily.  3  .  triamterene-hydrochlorothiazide (MAXZIDE-25) 37.5-25 MG tablet Take by mouth daily. Pt takes a half tablet  3  . valACYclovir (VALTREX) 1000 MG tablet Take 2 tablets (2000 mg) by mouth every 12 hours for 24 hours. 20 tablet 3   No current facility-administered medications for this visit.      ALLERGIES: Amoxicillin-pot clavulanate, Phenothiazines, and Sulfa antibiotics  Family History  Problem Relation Age of Onset  . Stroke Mother   . Prostate cancer Father   . Diabetes Sister   . Kidney cancer Paternal Grandmother     Social History   Socioeconomic History  . Marital status: Married    Spouse name: Not on file  . Number of children: Not on file  . Years of education: Not on file  . Highest education level: Not on file  Occupational History  . Not on file  Social Needs  . Financial resource strain: Not on file  . Food insecurity    Worry: Not on file    Inability: Not on file  .  Transportation needs    Medical: Not on file    Non-medical: Not on file  Tobacco Use  . Smoking status: Never Smoker  . Smokeless tobacco: Never Used  Substance and Sexual Activity  . Alcohol use: Yes    Alcohol/week: 6.0 standard drinks    Types: 6 Glasses of wine per week  . Drug use: Never  . Sexual activity: Yes    Birth control/protection: Post-menopausal, Surgical    Comment: tubal ligation  Lifestyle  . Physical activity    Days per week: Not on file    Minutes per session: Not on file  . Stress: Not on file  Relationships  . Social Herbalist on phone: Not on file    Gets together: Not on file    Attends religious service: Not on file    Active member of club or organization: Not on file    Attends meetings of clubs or organizations: Not on file    Relationship status: Not on file  . Intimate partner violence    Fear of current or ex partner: Not on file    Emotionally abused: Not on file    Physically abused: Not on file    Forced sexual activity: Not on file   Other Topics Concern  . Not on file  Social History Narrative  . Not on file    Review of Systems  Constitutional: Negative.   HENT: Negative.   Eyes: Negative.   Respiratory: Negative.   Cardiovascular: Negative.   Gastrointestinal: Negative.   Endocrine: Negative.   Genitourinary: Positive for frequency.       OAB  Musculoskeletal: Negative.   Skin: Negative.   Allergic/Immunologic: Negative.   Neurological: Negative.   Hematological: Negative.   Psychiatric/Behavioral: Negative.     PHYSICAL EXAMINATION:    BP 122/72 (BP Location: Left Arm, Patient Position: Sitting, Cuff Size: Large)   Pulse 76   Temp (!) 97.5 F (36.4 C) (Temporal)   Resp 12   Ht 5' (1.524 m)   Wt 157 lb (71.2 kg)   BMI 30.66 kg/m     General appearance: alert, cooperative and appears stated age   Pelvic: External genitalia:  no lesions              Urethra:  normal appearing urethra with no masses, tenderness or lesions              Bartholins and Skenes: normal                 Vagina: normal appearing vagina with normal color and discharge, no lesions              Cervix: no lesions                Bimanual Exam:  Uterus:  normal size, contour, position, consistency, mobility, non-tender              Adnexa: no mass, fullness, tenderness           Post void residual catheterization  Verbal consent for procedure.  Sterile prep with betadine.  Cath performed without difficulty - 36 cc clear, yellow urine obtained.   Chaperone was present for exam.  ASSESSMENT  Bladder pressure.  Overactive bladder.   HTN.   PLAN  Urine micro and cx.  Macrobid 100 gm bid.  Will start Ditropan XL 5 mg daily after UTI treated.  Side effects reviewed.  Return for pelvic US.  We discussed  urodynamic testing if her urinary incontinence does not improve with the Ditropan XL.   An After Visit Summary was printed and given to the patient.  _25_____ minutes face to face time of which over 50% was spent  in counseling.

## 2018-11-12 NOTE — Patient Instructions (Signed)
Oxybutynin extended-release tablets What is this medicine? OXYBUTYNIN (ox i BYOO ti nin) is used to treat overactive bladder. This medicine reduces the amount of bathroom visits. It may also help to control wetting accidents. This medicine may be used for other purposes; ask your health care provider or pharmacist if you have questions. COMMON BRAND NAME(S): Ditropan XL What should I tell my health care provider before I take this medicine? They need to know if you have any of these conditions:  autonomic neuropathy  dementia  difficulty passing urine  glaucoma  intestinal obstruction  kidney disease  liver disease  myasthenia gravis  Parkinson's disease  an unusual or allergic reaction to oxybutynin, other medicines, foods, dyes, or preservatives  pregnant or trying to get pregnant  breast-feeding How should I use this medicine? Take this medicine by mouth with a glass of water. Swallow whole, do not crush, cut, or chew. Follow the directions on the prescription label. You can take this medicine with or without food. Take your doses at regular intervals. Do not take your medicine more often than directed. Talk to your pediatrician regarding the use of this medicine in children. Special care may be needed. While this drug may be prescribed for children as young as 6 years for selected conditions, precautions do apply. Overdosage: If you think you have taken too much of this medicine contact a poison control center or emergency room at once. NOTE: This medicine is only for you. Do not share this medicine with others. What if I miss a dose? If you miss a dose, take it as soon as you can. If it is almost time for your next dose, take only that dose. Do not take double or extra doses. What may interact with this medicine?  antihistamines for allergy, cough and cold  atropine  certain medicines for bladder problems like oxybutynin, tolterodine  certain medicines for  Parkinson's disease like benztropine, trihexyphenidyl  certain medicines for stomach problems like dicyclomine, hyoscyamine  certain medicines for travel sickness like scopolamine  clarithromycin  erythromycin  ipratropium  medicines for fungal infections, like fluconazole, itraconazole, ketoconazole or voriconazole This list may not describe all possible interactions. Give your health care provider a list of all the medicines, herbs, non-prescription drugs, or dietary supplements you use. Also tell them if you smoke, drink alcohol, or use illegal drugs. Some items may interact with your medicine. What should I watch for while using this medicine? It may take a few weeks to notice the full benefit from this medicine. You may need to limit your intake tea, coffee, caffeinated sodas, and alcohol. These drinks may make your symptoms worse. You may get drowsy or dizzy. Do not drive, use machinery, or do anything that needs mental alertness until you know how this medicine affects you. Do not stand or sit up quickly, especially if you are an older patient. This reduces the risk of dizzy or fainting spells. Alcohol may interfere with the effect of this medicine. Avoid alcoholic drinks. Your mouth may get dry. Chewing sugarless gum or sucking hard candy, and drinking plenty of water may help. Contact your doctor if the problem does not go away or is severe. This medicine may cause dry eyes and blurred vision. If you wear contact lenses, you may feel some discomfort. Lubricating drops may help. See your eyecare professional if the problem does not go away or is severe. You may notice the shells of the tablets in your stool from time to time. This  is normal. Avoid extreme heat. This medicine can cause you to sweat less than normal. Your body temperature could increase to dangerous levels, which may lead to heat stroke. What side effects may I notice from receiving this medicine? Side effects that you  should report to your doctor or health care professional as soon as possible:  allergic reactions like skin rash, itching or hives, swelling of the face, lips, or tongue  agitation  breathing problems  confusion  fever  flushing (reddening of the skin)  hallucinations  memory loss  pain or difficulty passing urine  palpitations  unusually weak or tired Side effects that usually do not require medical attention (report to your doctor or health care professional if they continue or are bothersome):  constipation  headache  sexual difficulties (impotence) This list may not describe all possible side effects. Call your doctor for medical advice about side effects. You may report side effects to FDA at 1-800-FDA-1088. Where should I keep my medicine? Keep out of the reach of children. Store at room temperature between 15 and 30 degrees C (59 and 86 degrees F). Protect from moisture and humidity. Throw away any unused medicine after the expiration date. NOTE: This sheet is a summary. It may not cover all possible information. If you have questions about this medicine, talk to your doctor, pharmacist, or health care provider.  2020 Elsevier/Gold Standard (2013-07-18 10:57:06)

## 2018-11-12 NOTE — Telephone Encounter (Signed)
Patient thinks she may have a UTI and is "still having urinary issues". To triage to assist with scheduling.

## 2018-11-12 NOTE — Telephone Encounter (Signed)
Spoke with patient. Patient requesting OV for possible UTI vs OAB. Seen in office on 08/24/18, symptoms not resolved or improved. Reports pressure on bladder that started on 6/26, npt voiding normal amounts, has to change positions to completely empty bladder. Burning after intercourse. Denies dysuria, flank pain, fever/chills, blood in urine. Covid 19 prescreen negative, precautions reviewed. OV scheduled for today at 4:30pm with Dr. Quincy Simmonds. Patient verbalizes understanding and is agreeable.   Encounter closed.

## 2018-11-13 LAB — URINALYSIS, MICROSCOPIC ONLY
Casts: NONE SEEN /lpf
Epithelial Cells (non renal): NONE SEEN /hpf (ref 0–10)
WBC, UA: NONE SEEN /hpf (ref 0–5)

## 2018-11-13 LAB — URINE CULTURE: Organism ID, Bacteria: NO GROWTH

## 2018-11-14 ENCOUNTER — Telehealth: Payer: Self-pay | Admitting: Obstetrics and Gynecology

## 2018-11-14 ENCOUNTER — Encounter: Payer: Self-pay | Admitting: Obstetrics and Gynecology

## 2018-11-14 NOTE — Telephone Encounter (Signed)
Left message to call Octa Uplinger, RN at [facility] [phone].   

## 2018-11-14 NOTE — Telephone Encounter (Signed)
Patient returned call

## 2018-11-14 NOTE — Telephone Encounter (Signed)
Patient sent the following message through [facility]. Routing to triage to assist patient with request.  Dr Quincy Simmonds,  I see the urine test was normal. What would be in your opinion the reason for the blood in the urine and does that need further testing?  Dawn Duffy

## 2018-11-14 NOTE — Telephone Encounter (Signed)
Left message to call Sharee Pimple, RN at [facility].   Notes recorded by Nunzio Cobbs, MD on 11/14/2018 at 8:35 AM EDT  Results to patient through My Chart.   Hello Dawn Duffy,   I am sharing your test results from your recent visit.  Your urine microscopic exam and culture were normal.  You may stop the antibiotic and start the Ditropan XL.   I recommend you return to do the pelvic ultrasound.   Please contact the office for any questions.   Have a good day!   Josefa Half, MD

## 2018-11-14 NOTE — Telephone Encounter (Signed)
Spoke with patient, questions answered. PUS has been scheduled for 7/16 at 8:30am, consult at 9am with Dr. Quincy Simmonds. Patient thankful for return call.   Routing to provider for final review. Patient is agreeable to disposition. Will close encounter.   Cc: Lerry Liner

## 2018-11-27 ENCOUNTER — Other Ambulatory Visit: Payer: Self-pay

## 2018-11-29 ENCOUNTER — Ambulatory Visit (INDEPENDENT_AMBULATORY_CARE_PROVIDER_SITE_OTHER): Payer: BC Managed Care – PPO

## 2018-11-29 ENCOUNTER — Ambulatory Visit (INDEPENDENT_AMBULATORY_CARE_PROVIDER_SITE_OTHER): Payer: BC Managed Care – PPO | Admitting: Obstetrics and Gynecology

## 2018-11-29 ENCOUNTER — Other Ambulatory Visit: Payer: Self-pay

## 2018-11-29 ENCOUNTER — Encounter: Payer: Self-pay | Admitting: Obstetrics and Gynecology

## 2018-11-29 VITALS — BP 112/66 | HR 67 | Temp 98.4°F | Ht 60.0 in | Wt 158.2 lb

## 2018-11-29 DIAGNOSIS — R3989 Other symptoms and signs involving the genitourinary system: Secondary | ICD-10-CM

## 2018-11-29 DIAGNOSIS — N859 Noninflammatory disorder of uterus, unspecified: Secondary | ICD-10-CM | POA: Diagnosis not present

## 2018-11-29 NOTE — Progress Notes (Signed)
GYNECOLOGY  VISIT   HPI: 58 y.o.   Married  Caucasian  female   G2P2001 with No LMP recorded. Patient is postmenopausal.   here for ultrasound  Due to bladder pressure.   She was prescribed Ditropan XL 5 mg daily after having an I and O cath showing no significant PVR.  She did not start it yet.   She thinks her urinary symptoms are related to her Maxide.   She does wonder if the fluid is coming from the vagina.   UC negative growth 11/12/18.  She did complete the abx even though I told her she could stop.   Having stomach bloating.   Wants to have a paracervical block for the procedure.  GYNECOLOGIC HISTORY: No LMP recorded. Patient is postmenopausal. Contraception:  Postemenopausal Menopausal hormone therapy:  none Last mammogram:  September 2019 - normal at [facility] Last pap smear:   03/05/18 Neg:Neg HR HPV        OB History    Gravida  2   Para  2   Term  2   Preterm  0   AB  0   Living  1     SAB      TAB      Ectopic      Multiple      Live Births  1              Patient Active Problem List   Diagnosis Date Noted  . Essential hypertension 09/14/2017  . Palpitations 09/14/2017  . Atypical chest pain 09/14/2017  . Diastolic dysfunction 99/24/2683  . Hyperlipidemia 09/14/2017    Past Medical History:  Diagnosis Date  . Elevated prolactin level ([place])   . HSV-1 infection   . Hypertension   . Microadenoma [facility])     Past Surgical History:  Procedure Laterality Date  . CESAREAN SECTION     x 2.   . TUBAL LIGATION      Current Outpatient Medications  Medication Sig Dispense Refill  . Cetirizine HCl (ZYRTEC ALLERGY) 10 MG CAPS Take 1 tablet by mouth as directed.    . Cholecalciferol (VITAMIN D3) 1000 units CAPS Take 1 capsule by mouth daily.    . metoprolol succinate (TOPROL-XL) 50 MG 24 hr tablet Take 50 mg by mouth daily.  3  . nitrofurantoin, macrocrystal-monohydrate, (MACROBID) 100 MG capsule Take 1 capsule (100 mg total) by mouth 2  (two) times daily. Take for 5 days. 10 capsule 0  . oxybutynin (DITROPAN XL) 5 MG 24 hr tablet Take 1 tablet (5 mg total) by mouth at bedtime. 30 tablet 1  . triamterene-hydrochlorothiazide (MAXZIDE-25) 37.5-25 MG tablet Take by mouth daily. Pt takes a half tablet  3  . valACYclovir (VALTREX) 1000 MG tablet Take 2 tablets (2000 mg) by mouth every 12 hours for 24 hours. 20 tablet 3   No current facility-administered medications for this visit.      ALLERGIES: Amoxicillin-pot clavulanate, Phenothiazines, and Sulfa antibiotics  Family History  Problem Relation Age of Onset  . Stroke Mother   . Prostate cancer Father   . Diabetes Sister   . Kidney cancer Paternal Grandmother     Social History   Socioeconomic History  . Marital status: Married    Spouse name: Not on file  . Number of children: Not on file  . Years of education: Not on file  . Highest education level: Not on file  Occupational History  . Not on file  Social Needs  .  Financial resource strain: Not on file  . Food insecurity    Worry: Not on file    Inability: Not on file  . Transportation needs    Medical: Not on file    Non-medical: Not on file  Tobacco Use  . Smoking status: Never Smoker  . Smokeless tobacco: Never Used  Substance and Sexual Activity  . Alcohol use: Yes    Alcohol/week: 6.0 standard drinks    Types: 6 Glasses of wine per week  . Drug use: Never  . Sexual activity: Yes    Birth control/protection: Post-menopausal, Surgical    Comment: tubal ligation  Lifestyle  . Physical activity    Days per week: Not on file    Minutes per session: Not on file  . Stress: Not on file  Relationships  . Social Herbalist on phone: Not on file    Gets together: Not on file    Attends religious service: Not on file    Active member of club or organization: Not on file    Attends meetings of clubs or organizations: Not on file    Relationship status: Not on file  . Intimate partner  violence    Fear of current or ex partner: Not on file    Emotionally abused: Not on file    Physically abused: Not on file    Forced sexual activity: Not on file  Other Topics Concern  . Not on file  Social History Narrative  . Not on file    Review of Systems  Constitutional: Negative.   HENT: Negative.   Eyes: Negative.   Respiratory: Negative.   Cardiovascular: Negative.   Gastrointestinal: Negative.   Endocrine: Negative.   Genitourinary: Negative.   Musculoskeletal: Negative.   Skin: Negative.   Allergic/Immunologic: Negative.   Neurological: Negative.   Hematological: Negative.   Psychiatric/Behavioral: Negative.     PHYSICAL EXAMINATION:    BP 112/66   Pulse 67   Temp 98.4 F (36.9 C)   Ht 5' (1.524 m)   Wt 158 lb 3.2 oz (71.8 kg)   SpO2 98%   BMI 30.90 kg/m     General appearance: alert, cooperative and appears stated age   Uterus No myometrial masses.  Symmetrical endometrium.  EMS 4.63 with fluid in the endometrial canal.  Normal ovaries.  No free fluid.   ASSESSMENT  Bladder pressure.  Fluid in the endometrial canal.  PLAN  She will contact her PCP about changing her Maxide if appropriate.  She will hold off on Ditropan for now.  We discussed the ultrasound findings in detail and the indication for endometrial biopsy due to potential increased risk of endometrial cancer.  Procedure explained.  Endometrial cancer reviewed, signs and symptoms, treatment with a GYN ONC.   An After Visit Summary was printed and given to the patient.  _25_____ minutes face to face time of which over 50% was spent in counseling.

## 2018-11-29 NOTE — Patient Instructions (Signed)
Endometrial Biopsy  Endometrial biopsy is a procedure in which a tissue sample is taken from inside the uterus. The sample is taken from the endometrium, which is the lining of the uterus. The tissue sample is then checked under a microscope to see if the tissue is normal or abnormal. This procedure helps to determine where you are in your menstrual cycle and how hormone levels are affecting the lining of the uterus. This procedure may also be used to evaluate uterine bleeding or to diagnose endometrial cancer, endometrial tuberculosis, polyps, or other inflammatory conditions. Tell a health care provider about:  Any allergies you have.  All medicines you are taking, including vitamins, herbs, eye drops, creams, and over-the-counter medicines.  Any problems you or family members have had with anesthetic medicines.  Any blood disorders you have.  Any surgeries you have had.  Any medical conditions you have.  Whether you are pregnant or may be pregnant. What are the risks? Generally, this is a safe procedure. However, problems may occur, including:  Bleeding.  Pelvic infection.  Puncture of the wall of the uterus with the biopsy device (rare). What happens before the procedure?  Keep a record of your menstrual cycles as told by your health care provider. You may need to schedule your procedure for a specific time in your cycle.  You may want to bring a sanitary pad to wear after the procedure.  Ask your health care provider about: ? Changing or stopping your regular medicines. This is especially important if you are taking diabetes medicines or blood thinners. ? Taking medicines such as aspirin and ibuprofen. These medicines can thin your blood. Do not take these medicines before your procedure if your health care provider instructs you not to.  Plan to have someone take you home from the hospital or clinic. What happens during the procedure?  To lower your risk of infection: ?  Your health care team will wash or sanitize their hands.  You will lie on an exam table with your feet and legs supported as in a pelvic exam.  Your health care provider will insert an instrument (speculum) into your vagina to see your cervix.  Your cervix will be cleansed with an antiseptic solution.  A medicine (local anesthetic) will be used to numb the cervix.  A forceps instrument (tenaculum) will be used to hold your cervix steady for the biopsy.  A thin, rod-like instrument (uterine sound) will be inserted through your cervix to determine the length of your uterus and the location where the biopsy sample will be removed.  A thin, flexible tube (catheter) will be inserted through your cervix and into the uterus. The catheter will be used to collect the biopsy sample from your endometrial tissue.  The catheter and speculum will then be removed, and the tissue sample will be sent to a lab for examination. What happens after the procedure?  You will rest in a recovery area until you are ready to go home.  You may have mild cramping and a small amount of vaginal bleeding. This is normal.  It is up to you to get the results of your procedure. Ask your health care provider, or the department that is doing the procedure, when your results will be ready. Summary  Endometrial biopsy is a procedure in which a tissue sample is taken from the endometrium, which is the lining of the uterus.  This procedure may help to diagnose menstrual cycle problems, abnormal bleeding, or other conditions affecting  the endometrium.  Before the procedure, keep a record of your menstrual cycles as told by your health care provider.  The tissue sample that is removed will be checked under a microscope to see if it is normal or abnormal. This information is not intended to replace advice given to you by your health care provider. Make sure you discuss any questions you have with your health care provider.  Document Released: 09/02/2004 Document Revised: 04/14/2017 Document Reviewed: 05/18/2016 Elsevier Patient Education  2020 [employer].

## 2018-12-04 ENCOUNTER — Telehealth: Payer: Self-pay | Admitting: Obstetrics and Gynecology

## 2018-12-04 NOTE — Telephone Encounter (Signed)
Patient is ready to schedule her EMB.

## 2018-12-05 ENCOUNTER — Encounter: Payer: Self-pay | Admitting: Obstetrics and Gynecology

## 2018-12-05 NOTE — Telephone Encounter (Signed)
Call placed to patient to convey benefits. Patient understands/agreeable with benefits. Patient has limitations on when she can be scheduled. Message to triage for scheduling.

## 2018-12-05 NOTE — Telephone Encounter (Signed)
Spoke with patient. EMB scheduled for 12/11/18 at 11am with Dr. Quincy Simmonds. Advised to take Motrin 800 mg with food and water one hour before procedure. Patient declined earlier appt offered for 7/23. Patient is anxious and request procedure "numbing and shots" be clarified with Dr. Quincy Simmonds. Advised patient she is referring to paracervical block that can be done before the EMB, this is not uncommon. Still recommended Motrin 800 mg with food and water one hour before procedure. Patient become upset and states she was advised the injections she discussed were "routine" and part of the procedure. Again ensured patient that paracervical block not uncommon and does not change the EMB, patient request this be clarified further with Dr. Quincy Simmonds.   Dr. Quincy Simmonds -please review.

## 2018-12-05 NOTE — Telephone Encounter (Signed)
Please contact patient in follow up to her questions.  1 - I will do a paracervical block before doing the EMB.  This medication is not always used before doing the biopsy.  It is a decision between the provider and the patient.   2 - The endometrial biopsy is the next step in care.  We are not able to jump to hysterectomy without having a tissue specimen done.  The fluid in the endometrial canal may be due to cervical stenosis and no endometrial pathology at all.  Hysterectomy may not be indicated.   We can review this further at the time of the procedure.   I hope this helps.

## 2018-12-05 NOTE — Telephone Encounter (Signed)
Patient is calling regarding EMB pre-cert and scheduling. Patient is eager to get scheduled and come in for EMB.   Cc: Suzy and Rosa.

## 2018-12-05 NOTE — Telephone Encounter (Signed)
Patient sent the following correspondence through [place]. Routing to triage to assist patient with request.  Dr Quincy Simmonds,    2 questions:    1. When I made the appointment today for the biopsy the triage nurse said I would not be numbed and just take 800 mg of motrin before. During my visit you and I specifically discussed this and you said this procedure does involve you numbing me with three shots before you dilate the cervix and begin the procedure. She made it sound like I am a special case if I want that. Please clarify.     2. After thinking more about the fluid in my uterus and the lining thickness and after speaking with a doctor friend I am seriously considering a hysterectomy regardless if it is cancer so I am not left with a uterus with this situation. Is this a possibility and if so would I still need the biopsy ahead of time?    Thank you  Dawn Duffy

## 2018-12-05 NOTE — Telephone Encounter (Signed)
Return call to patient. Questions answered as directed by Dr Quincy Simmonds.  Encounter closed.

## 2018-12-06 ENCOUNTER — Telehealth: Payer: Self-pay | Admitting: Obstetrics and Gynecology

## 2018-12-06 MED ORDER — ALPRAZOLAM 1 MG PO TABS: ORAL_TABLET | ORAL | 0 refills | Status: DC

## 2018-12-06 NOTE — Telephone Encounter (Signed)
Patient notified call reviewed by Dr Sabra Heck. Printed RX left at desk with consent.  Encounter closed.

## 2018-12-06 NOTE — Telephone Encounter (Signed)
Return call to patient.  She has decided she would like Xanax for endometrial biopsy as previously discussed with Dr Quincy Simmonds. States she is so anxious about this procedure. Reports she does not take a lot of medication.  Discussed may be able to start with 1/2 tablet and then take 2nd half if needed.  Will come to office to office to sign consent and pick up prescription. Is aware will need driver to bring her and take her home. She should still take Motrin 800 mg one hour prior with food.   Routing to Dr Quincy Simmonds.

## 2018-12-06 NOTE — Telephone Encounter (Signed)
Patient is calling to speak with a nurse with questions regarding her EMB procedure.

## 2018-12-07 ENCOUNTER — Other Ambulatory Visit: Payer: Self-pay

## 2018-12-07 NOTE — Telephone Encounter (Signed)
Patient in office, picked up Xanax RX. EMB consent signed and placed in drawer at front office.

## 2018-12-10 ENCOUNTER — Telehealth: Payer: Self-pay | Admitting: Obstetrics & Gynecology

## 2018-12-10 NOTE — Telephone Encounter (Signed)
Pharmacy is calling to see if prescription for Xanax must be filled as brand name or not.

## 2018-12-10 NOTE — Telephone Encounter (Signed)
Call returned to [place], spoke with [facility]. Advised Xanax may be filled as brand or generic, patient preference.   Encounter closed.

## 2018-12-11 ENCOUNTER — Encounter: Payer: Self-pay | Admitting: Obstetrics and Gynecology

## 2018-12-11 ENCOUNTER — Ambulatory Visit (INDEPENDENT_AMBULATORY_CARE_PROVIDER_SITE_OTHER): Payer: BC Managed Care – PPO | Admitting: Obstetrics and Gynecology

## 2018-12-11 ENCOUNTER — Other Ambulatory Visit: Payer: Self-pay

## 2018-12-11 DIAGNOSIS — N840 Polyp of corpus uteri: Secondary | ICD-10-CM | POA: Diagnosis not present

## 2018-12-11 DIAGNOSIS — N859 Noninflammatory disorder of uterus, unspecified: Secondary | ICD-10-CM | POA: Diagnosis not present

## 2018-12-11 NOTE — Patient Instructions (Signed)

## 2018-12-11 NOTE — Progress Notes (Signed)
GYNECOLOGY  VISIT   HPI: 58 y.o.   Married  Caucasian  female   G2P2001 with No LMP recorded. Patient is postmenopausal.   here for EMB.  She had a pelvic US due to persistent pressure in the bladder area.  She has fluid in the endometrial canal and measurement over 3 mm. Her ovaries are normal.   She also reports fluid in her underwear that is persistent and not urine.  No vaginal bleeding.   She takes Maxide.   Took Xanax in preparation for the visit today.   GYNECOLOGIC HISTORY: No LMP recorded. Patient is postmenopausal. Contraception:  Postmenopausal Menopausal hormone therapy:  none Last mammogram:  12/15/17 -- [facility] to fax report Last pap smear:   03/05/18 Neg:Neg HR HPV        OB History    Gravida  2   Para  2   Term  2   Preterm  0   AB  0   Living  1     SAB      TAB      Ectopic      Multiple      Live Births  1              Patient Active Problem List   Diagnosis Date Noted  . Essential hypertension 09/14/2017  . Palpitations 09/14/2017  . Atypical chest pain 09/14/2017  . Diastolic dysfunction 14/97/0263  . Hyperlipidemia 09/14/2017    Past Medical History:  Diagnosis Date  . Elevated prolactin level ([place])   . HSV-1 infection   . Hypertension   . Microadenoma [facility])     Past Surgical History:  Procedure Laterality Date  . CESAREAN SECTION     x 2.   . TUBAL LIGATION      Current Outpatient Medications  Medication Sig Dispense Refill  . ALPRAZolam (XANAX) 1 MG tablet Take 1/2 tablet one hour prior to procedure and repeat in one hour if needed. 5 tablet 0  . Cetirizine HCl (ZYRTEC ALLERGY) 10 MG CAPS Take 1 tablet by mouth as directed.    . Cholecalciferol (VITAMIN D3) 1000 units CAPS Take 1 capsule by mouth daily.    . metoprolol succinate (TOPROL-XL) 50 MG 24 hr tablet Take 50 mg by mouth daily.  3  . triamterene-hydrochlorothiazide (MAXZIDE-25) 37.5-25 MG tablet Take by mouth daily. Pt takes a half tablet  3  .  valACYclovir (VALTREX) 1000 MG tablet Take 2 tablets (2000 mg) by mouth every 12 hours for 24 hours. 20 tablet 3  . oxybutynin (DITROPAN XL) 5 MG 24 hr tablet Take 1 tablet (5 mg total) by mouth at bedtime. (Patient not taking: Reported on 12/11/2018) 30 tablet 1   No current facility-administered medications for this visit.      ALLERGIES: Amoxicillin-pot clavulanate, Phenothiazines, and Sulfa antibiotics  Family History  Problem Relation Age of Onset  . Stroke Mother   . Prostate cancer Father   . Diabetes Sister   . Kidney cancer Paternal Grandmother     Social History   Socioeconomic History  . Marital status: Married    Spouse name: Not on file  . Number of children: Not on file  . Years of education: Not on file  . Highest education level: Not on file  Occupational History  . Not on file  Social Needs  . Financial resource strain: Not on file  . Food insecurity    Worry: Not on file    Inability: Not on  file  . Transportation needs    Medical: Not on file    Non-medical: Not on file  Tobacco Use  . Smoking status: Never Smoker  . Smokeless tobacco: Never Used  Substance and Sexual Activity  . Alcohol use: Yes    Alcohol/week: 6.0 standard drinks    Types: 6 Glasses of wine per week  . Drug use: Never  . Sexual activity: Yes    Birth control/protection: Post-menopausal, Surgical    Comment: tubal ligation  Lifestyle  . Physical activity    Days per week: Not on file    Minutes per session: Not on file  . Stress: Not on file  Relationships  . Social Herbalist on phone: Not on file    Gets together: Not on file    Attends religious service: Not on file    Active member of club or organization: Not on file    Attends meetings of clubs or organizations: Not on file    Relationship status: Not on file  . Intimate partner violence    Fear of current or ex partner: Not on file    Emotionally abused: Not on file    Physically abused: Not on file     Forced sexual activity: Not on file  Other Topics Concern  . Not on file  Social History Narrative  . Not on file    Review of Systems  Constitutional: Negative.   HENT: Negative.   Eyes: Negative.   Respiratory: Negative.   Cardiovascular: Negative.   Gastrointestinal: Negative.   Endocrine: Negative.   Genitourinary: Negative.   Musculoskeletal: Negative.   Skin: Negative.   Allergic/Immunologic: Negative.   Neurological: Negative.   Hematological: Negative.   Psychiatric/Behavioral: Negative.     PHYSICAL EXAMINATION:    BP 122/70 (BP Location: Right Arm, Patient Position: Sitting, Cuff Size: Normal)   Pulse 84   Temp (!) 97.1 F (36.2 C) (Temporal)   Resp 12   Ht 5' (1.524 m)   Wt 156 lb (70.8 kg)   BMI 30.47 kg/m     General appearance: alert, cooperative and appears stated age   EMB Consent for procedure.  Sterile prep with Hibiclens.  Paracervical block with 10 cc 1% lidocaine - lot [id number], exp 11/14/19. [place]elle passed twice to 6.5 cm.  Tissue to pathology.  No complications.  Minimal EBL.  Chaperone was present for exam.  ASSESSMENT  Fluid in endometrial canal.   Bladder pressure.   PLAN  FU EMB.  Instructions and precautions given.  Final plan to follow.  She will follow up with her PCP regarding her Maxide to see if this can be modified or changed to another option.   An After Visit Summary was printed and given to the patient.

## 2018-12-18 ENCOUNTER — Telehealth: Payer: Self-pay | Admitting: *Deleted

## 2018-12-18 DIAGNOSIS — N840 Polyp of corpus uteri: Secondary | ICD-10-CM

## 2018-12-18 NOTE — Telephone Encounter (Signed)
Most of the time, dilation of the cervix is not needed for the sonohysterogram as the cannula used to instill the fluid is really tiny.  The procedure causes minor cramping, but much less compared to the endometrial biopsy.

## 2018-12-18 NOTE — Telephone Encounter (Signed)
-----   Message from Nunzio Cobbs, MD sent at 12/17/2018 12:28 PM EDT ----- Please report results of EMB showing a benign endometrial polyp.  Please schedule a sonohysterogram with me so we can look at the internal uterine cavity and determine if the polyp is microscopic (meaning detectable only with the biopsy) or macroscopic (meaning something visible there that really needs to be then removed by a dilation and curettage procedure). This will need to go to precert.

## 2018-12-18 NOTE — Telephone Encounter (Signed)
Spoke with patient, advised as seen below per Dr. Quincy Simmonds. Patient states she will still plan to take xanax prior, has Rx. Again reminded patient she will need to come into office prior to procedure to sign consent and will need a driver, patient agreeable.   Patient will await return call from business office to review benefits and then schedule.   Routing to [employer] and [employer] 

## 2018-12-18 NOTE — Telephone Encounter (Signed)
Notes recorded by Burnice Logan, RN on 12/18/2018 at 11:01 AM EDT  Spoke with patient, advised as seen below per Dr. Quincy Simmonds. Brief explanation of procedure provided.  1. Patient states cervix did not need to be dilated for EMB, will it need to be for [facility]?   2. Has RX for Xanax to take prior to procedure, advised will need to sign procedure consent prior to day of procedure. Will need driver.   3. Order placed for precert, patient request to review benefits prior to scheduling.   See telephone encounter dated 12/18/18 to review with provider.      Routing to Dr. Quincy Simmonds to advise.  Cc: Lerry Liner, Magdalene Patricia

## 2018-12-19 NOTE — Telephone Encounter (Signed)
Call to patient. Patient states she has 2 questions. She wanted to confirm who will actually be doing the Sonohysterogram procedure and she needs "confirmation" that Dr. Quincy Simmonds will numb her like she did with the EMB. RN advised Dr. Quincy Simmonds will be in [place] room with Ultrasonographer performing the sonohysterogram. Patient voiced understanding. Patient states she is mostly concerned about being numbed for the procedure. States Dr. Quincy Simmonds knows her history and she just needs "cofirmation this will happen" so she doesn't have such anxiety over the next two weeks. RN advised would review with Dr. Quincy Simmonds and return call. Patient agreeable. Patient also states she will make arrangements to come to the office to sign the consent form.   Routing to provider for review.

## 2018-12-19 NOTE — Telephone Encounter (Signed)
Message given to patient as seen below from Dr. Quincy Simmonds. Patient verbalized understanding and appreciative of phone call.

## 2018-12-19 NOTE — Telephone Encounter (Signed)
Call placed to Dawn Duffy to review benefit for sonohysterogram. Dawn Duffy acknowledges understanding of information presented. Dawn Duffy is scheduled 01/03/2019 with Dr Quincy Simmonds. Dawn Duffy declined earlier appointment on 12/27/2018. Dawn Duffy is aware of appointment date, arrival time and cancellation policy.   Dawn Duffy request to speak with nurse, regarding additional questions regarding procedure.  Routing to Triage Nurse

## 2018-12-19 NOTE — Telephone Encounter (Signed)
Please let Glyn know that I will be happy to do the numbing for the sonohysterogram!  She will have the ultrasound performed first by the technician and then I will come in the room and actually do the sonohysterogram.

## 2018-12-19 NOTE — Telephone Encounter (Signed)
Message left to return call to Triage Nurse at [phone].    

## 2019-01-01 ENCOUNTER — Other Ambulatory Visit: Payer: Self-pay

## 2019-01-01 NOTE — Telephone Encounter (Signed)
Patient in office today to sign consent form for Sonohysterogram scheduled for 01-03-2019.

## 2019-01-03 ENCOUNTER — Encounter: Payer: Self-pay | Admitting: Obstetrics and Gynecology

## 2019-01-03 ENCOUNTER — Ambulatory Visit (INDEPENDENT_AMBULATORY_CARE_PROVIDER_SITE_OTHER): Payer: BC Managed Care – PPO

## 2019-01-03 ENCOUNTER — Other Ambulatory Visit: Payer: BC Managed Care – PPO

## 2019-01-03 ENCOUNTER — Other Ambulatory Visit: Payer: Self-pay

## 2019-01-03 ENCOUNTER — Ambulatory Visit (INDEPENDENT_AMBULATORY_CARE_PROVIDER_SITE_OTHER): Payer: BC Managed Care – PPO | Admitting: Obstetrics and Gynecology

## 2019-01-03 VITALS — BP 124/76 | HR 76 | Temp 97.6°F | Ht 60.0 in | Wt 158.0 lb

## 2019-01-03 DIAGNOSIS — R3989 Other symptoms and signs involving the genitourinary system: Secondary | ICD-10-CM | POA: Diagnosis not present

## 2019-01-03 DIAGNOSIS — N840 Polyp of corpus uteri: Secondary | ICD-10-CM | POA: Diagnosis not present

## 2019-01-03 NOTE — Progress Notes (Signed)
GYNECOLOGY  VISIT   HPI: 58 y.o.   Married  Caucasian  female   G2P2001 with Patient's last menstrual period was 05/17/2007 (approximate).   here for   Sonohysterogram to check for macroscopic findings of an endometrial polyp noted on endometrial biopsy.   Patient has history of bladder pressure and wetness.  This is occurring more in the am.  She thinks this may be related to her diuretic.  She has not started her medication for overactive bladder.   She had a pelvic ultrasound due to her ongoing pressure and this showed some fluid in the endometrial canal and had an EMS measuring over 3 mm.  EMB was performed with a paracervical block on 12/11/18 and this showed a benign endometrial polyp. She therefore presents for this sonohysterogram today.   Patient does not have postmenopausal bleeding.   She took Motrin and Xanax in preparation for this visit.   GYNECOLOGIC HISTORY: Patient's last menstrual period was 05/17/2007 (approximate). Contraception:  NA Menopausal hormone therapy: NA Last mammogram:  [facility] 12/15/17 - BI-RADS2.  She is scheduled 02/01/19. Last pap smear:   03/05/18 - Neg, neg HR HPV.         OB History    Gravida  2   Para  2   Term  2   Preterm  0   AB  0   Living  1     SAB      TAB      Ectopic      Multiple      Live Births  1              Patient Active Problem List   Diagnosis Date Noted  . Essential hypertension 09/14/2017  . Palpitations 09/14/2017  . Atypical chest pain 09/14/2017  . Diastolic dysfunction 48/25/0037  . Hyperlipidemia 09/14/2017    Past Medical History:  Diagnosis Date  . Elevated prolactin level ([place])   . HSV-1 infection   . Hypertension   . Microadenoma [facility])     Past Surgical History:  Procedure Laterality Date  . CESAREAN SECTION     x 2.   . TUBAL LIGATION      Current Outpatient Medications  Medication Sig Dispense Refill  . ALPRAZolam (XANAX) 1 MG tablet Take 1/2 tablet one hour prior to  procedure and repeat in one hour if needed. 5 tablet 0  . Cetirizine HCl (ZYRTEC ALLERGY) 10 MG CAPS Take 1 tablet by mouth as directed.    . Cholecalciferol (VITAMIN D3) 1000 units CAPS Take 1 capsule by mouth daily.    . metoprolol succinate (TOPROL-XL) 50 MG 24 hr tablet Take 50 mg by mouth daily.  3  . oxybutynin (DITROPAN XL) 5 MG 24 hr tablet Take 1 tablet (5 mg total) by mouth at bedtime. 30 tablet 1  . triamterene-hydrochlorothiazide (MAXZIDE-25) 37.5-25 MG tablet Take by mouth daily. Pt takes a half tablet  3  . valACYclovir (VALTREX) 1000 MG tablet Take 2 tablets (2000 mg) by mouth every 12 hours for 24 hours. 20 tablet 3   No current facility-administered medications for this visit.      ALLERGIES: Amoxicillin-pot clavulanate, Phenothiazines, and Sulfa antibiotics  Family History  Problem Relation Age of Onset  . Stroke Mother   . Prostate cancer Father   . Diabetes Sister   . Kidney cancer Paternal Grandmother     Social History   Socioeconomic History  . Marital status: Married    Spouse name: Not  on file  . Number of children: Not on file  . Years of education: Not on file  . Highest education level: Not on file  Occupational History  . Not on file  Social Needs  . Financial resource strain: Not on file  . Food insecurity    Worry: Not on file    Inability: Not on file  . Transportation needs    Medical: Not on file    Non-medical: Not on file  Tobacco Use  . Smoking status: Never Smoker  . Smokeless tobacco: Never Used  Substance and Sexual Activity  . Alcohol use: Yes    Alcohol/week: 6.0 standard drinks    Types: 6 Glasses of wine per week  . Drug use: Never  . Sexual activity: Yes    Birth control/protection: Post-menopausal, Surgical    Comment: tubal ligation  Lifestyle  . Physical activity    Days per week: Not on file    Minutes per session: Not on file  . Stress: Not on file  Relationships  . Social Herbalist on phone: Not on  file    Gets together: Not on file    Attends religious service: Not on file    Active member of club or organization: Not on file    Attends meetings of clubs or organizations: Not on file    Relationship status: Not on file  . Intimate partner violence    Fear of current or ex partner: Not on file    Emotionally abused: Not on file    Physically abused: Not on file    Forced sexual activity: Not on file  Other Topics Concern  . Not on file  Social History Narrative  . Not on file    Review of Systems  All other systems reviewed and are negative.   PHYSICAL EXAMINATION:    BP 124/76   Pulse 76   Temp 97.6 F (36.4 C) (Temporal)   Ht 5' (1.524 m)   Wt 158 lb (71.7 kg)   LMP 05/17/2007 (Approximate)   BMI 30.86 kg/m     General appearance: alert, cooperative and appears stated age   22.  Consent for procedure.  Sterile prep with Hibiclens.  Paracervical block with 10 cc 1% lidocaine, lot [id number], exp April 2022 Sterile NS injected in uterine cavity.  No filling defects.   ASSESSMENT  Pelvic and bladder pressure.  Microscopic endometrial polyp.   PLAN  Reassurance regarding negative ultrasound findings.  No macroscopic polyp noted.  She will follow up with her PCP regarding her antiHTN medication.  She will consider starting her ditropan XL for overactive bladder. FU prn.    An After Visit Summary was printed and given to the patient.  _15_____ minutes face to face time of which over 50% was spent in counseling.

## 2019-02-01 ENCOUNTER — Encounter: Payer: Self-pay | Admitting: Obstetrics and Gynecology

## 2019-02-01 DIAGNOSIS — Z1231 Encounter for screening mammogram for malignant neoplasm of breast: Secondary | ICD-10-CM | POA: Diagnosis not present

## 2019-04-04 DIAGNOSIS — I5189 Other ill-defined heart diseases: Secondary | ICD-10-CM | POA: Diagnosis not present

## 2019-04-04 DIAGNOSIS — Z13220 Encounter for screening for lipoid disorders: Secondary | ICD-10-CM | POA: Diagnosis not present

## 2019-04-04 DIAGNOSIS — I10 Essential (primary) hypertension: Secondary | ICD-10-CM | POA: Diagnosis not present

## 2019-04-04 DIAGNOSIS — R000 Tachycardia, unspecified: Secondary | ICD-10-CM | POA: Diagnosis not present

## 2019-04-04 DIAGNOSIS — E559 Vitamin D deficiency, unspecified: Secondary | ICD-10-CM | POA: Diagnosis not present

## 2019-04-04 DIAGNOSIS — E229 Hyperfunction of pituitary gland, unspecified: Secondary | ICD-10-CM | POA: Diagnosis not present

## 2019-04-04 DIAGNOSIS — Z0000 Encounter for general adult medical examination without abnormal findings: Secondary | ICD-10-CM | POA: Diagnosis not present

## 2019-04-04 DIAGNOSIS — Z23 Encounter for immunization: Secondary | ICD-10-CM | POA: Diagnosis not present

## 2019-04-04 DIAGNOSIS — D352 Benign neoplasm of pituitary gland: Secondary | ICD-10-CM | POA: Diagnosis not present

## 2019-10-03 DIAGNOSIS — L821 Other seborrheic keratosis: Secondary | ICD-10-CM | POA: Diagnosis not present

## 2019-10-03 DIAGNOSIS — D225 Melanocytic nevi of trunk: Secondary | ICD-10-CM | POA: Diagnosis not present

## 2019-10-03 DIAGNOSIS — D2262 Melanocytic nevi of left upper limb, including shoulder: Secondary | ICD-10-CM | POA: Diagnosis not present

## 2019-10-03 DIAGNOSIS — D2261 Melanocytic nevi of right upper limb, including shoulder: Secondary | ICD-10-CM | POA: Diagnosis not present

## 2019-10-28 IMAGING — CT CT HEART MORP W/ CTA COR W/ SCORE W/ CA W/CM &/OR W/O CM
4 of 7 series · 8 of 20 positions shown, 9 images · non-contrast
Comparison: None.

CLINICAL DATA: Chest pain

EXAM:
Cardiac CTA
MEDICATIONS:
Sub lingual nitro. 4mg x 2 and lopressor 5mg IV
TECHNIQUE: The patient was scanned on a Siemens [REDACTED]ice scanner. Gantry
rotation speed was 250 msecs. Collimation was 0.6 mm. A 100 kV
prospective scan was triggered in the ascending thoracic aorta at
35-75% of the R-R interval. Average HR during the scan was 60 bpm.
The 3D data set was interpreted on a dedicated work station using
MPR, MIP and VRT modes. A total of 80cc of contrast was used.

[Series 6: best diast 76 % · axial · 0.28mm/px · z∈[+1143,+1176]mm · 2 of 251 slices shown, 3 images]
[im 84/251  vessel]
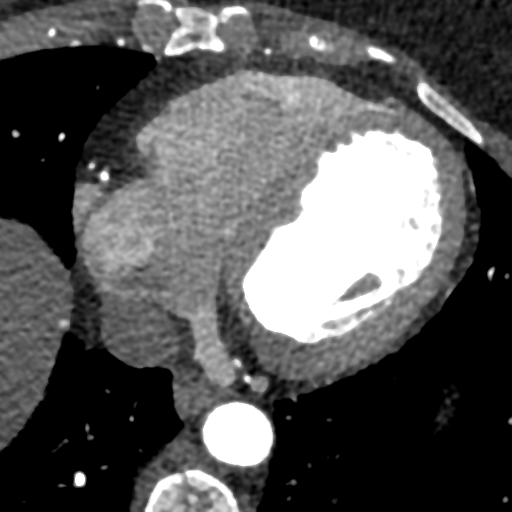
[im 84/251  lung]
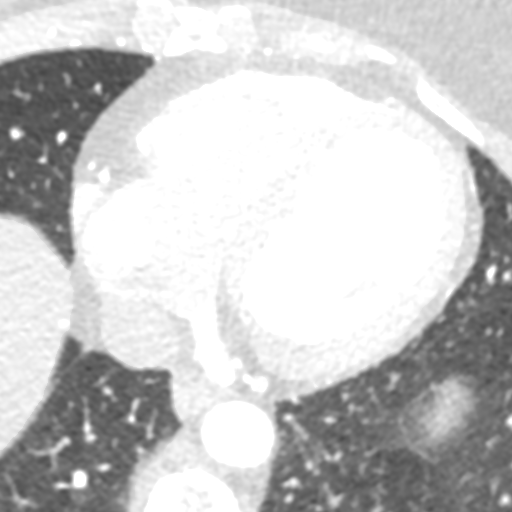
[im 167/251  vessel]
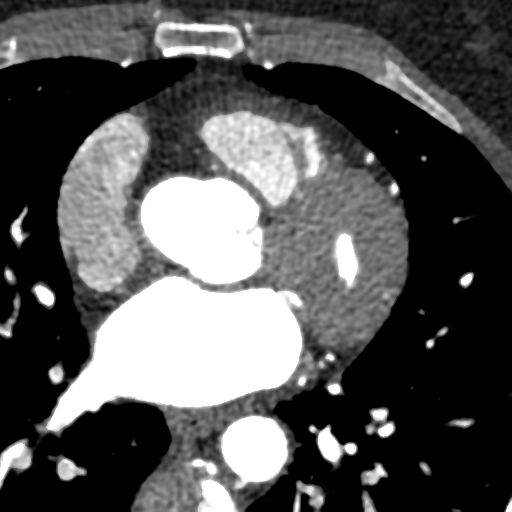

[Series 7: best syst 45 % · axial · 0.28mm/px · z∈[+1143,+1176]mm · 2 of 251 slices shown]
[im 84/251  vessel]
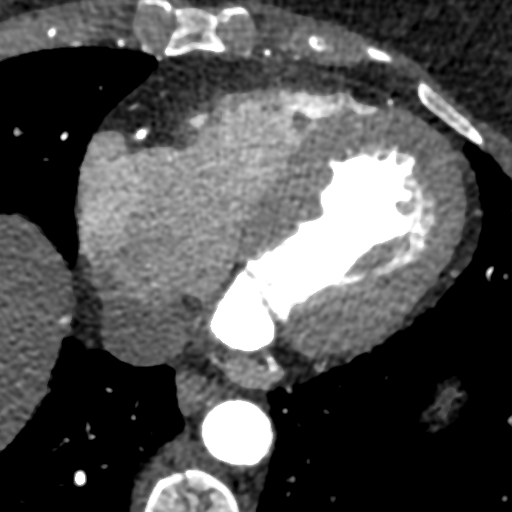
[im 167/251  vessel]
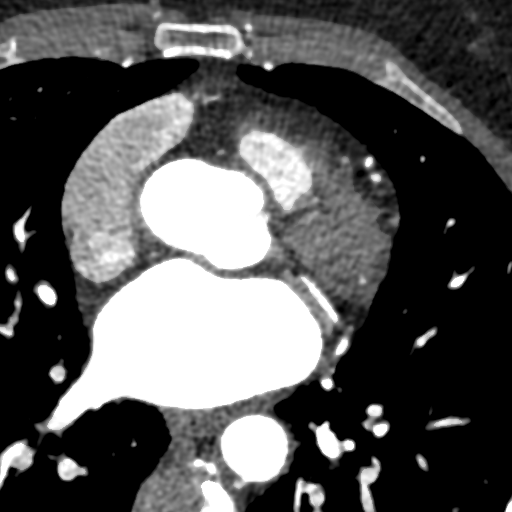

[Series 8: ts diast sharp 76 % · axial · 0.28mm/px · z∈[+1143,+1176]mm · 2 of 251 slices shown]
[im 84/251  lung]
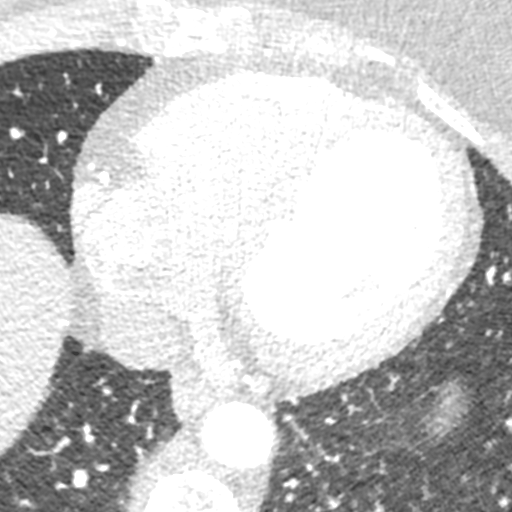
[im 167/251  lung]
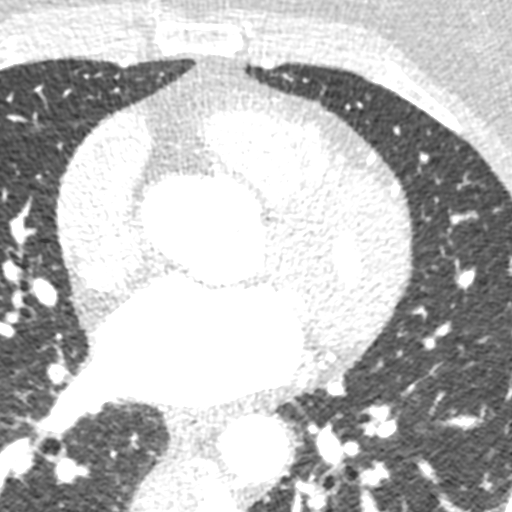

[Series 9: ts syst sharp 45 % · axial · 0.28mm/px · z∈[+1143,+1176]mm · 2 of 251 slices shown]
[im 84/251  lung]
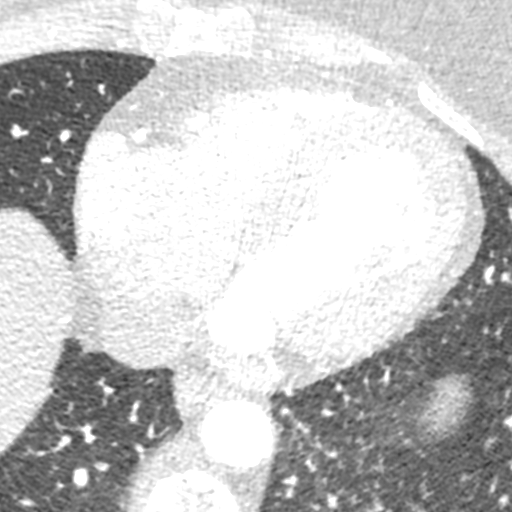
[im 167/251  lung]
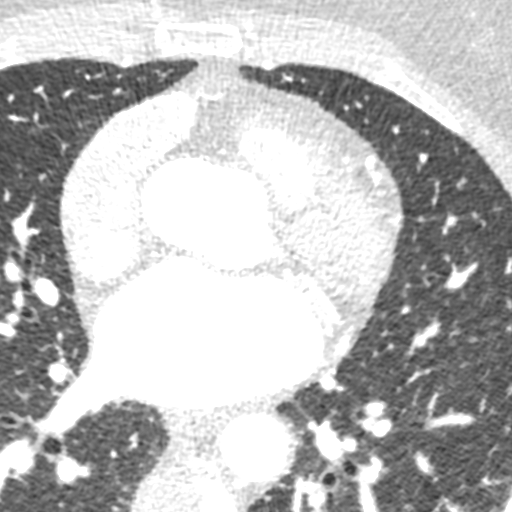

[8 of 20 positions shown; findings below may reference images not displayed]

FINDINGS: Non-cardiac: See separate report from [REDACTED].

The pulmonary veins drain normally to the left atrium.

Calcium Score: 0 Agatston units.

Coronary Arteries: Right dominant with no anomalies

LM: No plaque or stenosis.

LAD system: No plaque or stenosis.

Circumflex system: No plaque or stenosis.

RCA system: No plaque or stenosis.
IMPRESSION: 1. Coronary calcium score 0 Agatston units, suggesting low risk for
future cardiac events.

2.  No coronary disease noted on CT angiogram.

Sawyer Doan

EXAM:
OVER-READ INTERPRETATION  CT CHEST

The following report is an over-read performed by radiologist Dr.
Nivirus Databex [[email or web address]] on 02/20/2018. This over-read
does not include interpretation of cardiac or coronary anatomy or
pathology. The coronary CTA interpretation by the cardiologist is
attached.
FINDINGS: Vascular: Heart is normal size.  Visualized aorta normal caliber.

Mediastinum/Nodes: No adenopathy in the lower mediastinum or hila.

Lungs/Pleura: No confluent opacities or effusions.

Upper Abdomen: Imaging into the upper abdomen shows no acute
findings.

Musculoskeletal: Chest wall soft tissues are unremarkable. No acute
bony abnormality.
IMPRESSION: No acute or significant extracardiac abnormality.

## 2020-02-18 ENCOUNTER — Ambulatory Visit: Payer: BC Managed Care – PPO | Admitting: Obstetrics and Gynecology

## 2020-03-24 DIAGNOSIS — Z23 Encounter for immunization: Secondary | ICD-10-CM | POA: Diagnosis not present

## 2020-03-24 DIAGNOSIS — Z1231 Encounter for screening mammogram for malignant neoplasm of breast: Secondary | ICD-10-CM | POA: Diagnosis not present

## 2020-08-20 ENCOUNTER — Ambulatory Visit: Payer: BC Managed Care – PPO | Admitting: Obstetrics and Gynecology

## 2020-08-20 DIAGNOSIS — R109 Unspecified abdominal pain: Secondary | ICD-10-CM | POA: Diagnosis not present

## 2020-08-21 DIAGNOSIS — R109 Unspecified abdominal pain: Secondary | ICD-10-CM | POA: Diagnosis not present

## 2020-09-01 DIAGNOSIS — M5459 Other low back pain: Secondary | ICD-10-CM | POA: Diagnosis not present

## 2020-09-08 DIAGNOSIS — M5459 Other low back pain: Secondary | ICD-10-CM | POA: Diagnosis not present

## 2020-09-16 DIAGNOSIS — M545 Low back pain, unspecified: Secondary | ICD-10-CM | POA: Diagnosis not present

## 2020-10-06 ENCOUNTER — Ambulatory Visit (INDEPENDENT_AMBULATORY_CARE_PROVIDER_SITE_OTHER): Payer: BC Managed Care – PPO | Admitting: Obstetrics and Gynecology

## 2020-10-06 ENCOUNTER — Other Ambulatory Visit (HOSPITAL_COMMUNITY)
Admission: RE | Admit: 2020-10-06 | Discharge: 2020-10-06 | Disposition: A | Discharge status: Home / Self Care | Payer: BC Managed Care – PPO | Source: Ambulatory Visit | Attending: Obstetrics and Gynecology | Admitting: Obstetrics and Gynecology

## 2020-10-06 ENCOUNTER — Encounter: Payer: Self-pay | Admitting: Obstetrics and Gynecology

## 2020-10-06 ENCOUNTER — Other Ambulatory Visit: Payer: Self-pay

## 2020-10-06 VITALS — BP 130/78 | HR 69 | Ht 60.0 in | Wt 151.0 lb

## 2020-10-06 DIAGNOSIS — R319 Hematuria, unspecified: Secondary | ICD-10-CM

## 2020-10-06 DIAGNOSIS — Z01419 Encounter for gynecological examination (general) (routine) without abnormal findings: Secondary | ICD-10-CM | POA: Diagnosis not present

## 2020-10-06 DIAGNOSIS — N898 Other specified noninflammatory disorders of vagina: Secondary | ICD-10-CM | POA: Insufficient documentation

## 2020-10-06 LAB — URINALYSIS, COMPLETE W/RFL CULTURE
Protein, ur: NEGATIVE
Specific Gravity, Urine: 1.002 (ref 1.001–1.035)
WBC, UA: NONE SEEN /HPF (ref 0–5)

## 2020-10-06 MED ORDER — VALACYCLOVIR HCL 1 G PO TABS: ORAL_TABLET | ORAL | 3 refills | Status: AC

## 2020-10-06 NOTE — Progress Notes (Signed)
60 y.o. G34P2001 Married Caucasian female here for annual exam.    Doing some standing and squatting to empty her bladder.  Stopped using Poise pads.  Does not feel her bladder has dropped.  No leakage with laugh or cough.  Leaks if she holds her urine too long.   No problems with BMs.   No vaginal bleeding.   Some vaginal itching sometimes.   Uses KY Jelly for moisturizing the vagina.  Works well.   PCP: Josetta Huddle, MD     Patient's last menstrual period was 05/17/2007 (approximate).           Sexually active: Yes.    The current method of family planning is post menopausal status.    Exercising: No.  The patient does not participate in regular exercise at present. Smoker:  no  Health Maintenance: Pap: 03-05-18 Neg:Neg HR HPV, 2016 normal per patient History of abnormal Pap:  no MMG:  03-24-20 Neg/Birads1 Colonoscopy: 2007 normal--knows needs to schedule BMD: many years ago  Result :Normal TDaP: PCP Gardasil:   no WIO:XBDZH Hep C: NEVER Screening Labs:  PCP.    reports that she has never smoked. She has never used smokeless tobacco. She reports current alcohol use of about 4.0 standard drinks of alcohol per week. She reports that she does not use drugs.  Past Medical History:  Diagnosis Date  . Elevated prolactin level   . HSV-1 infection   . Hypertension   . Microadenoma     Past Surgical History:  Procedure Laterality Date  . CESAREAN SECTION     x 2.   . TUBAL LIGATION      Current Outpatient Medications  Medication Sig Dispense Refill  . Cetirizine HCl 10 MG CAPS Take 1 tablet by mouth as directed.    . Cholecalciferol (VITAMIN D3) 1000 units CAPS Take 1 capsule by mouth daily.    . clobetasol (TEMOVATE) 0.05 % external solution SMARTSIG:1 Milliliter(s) Topical Every Night    . metoprolol succinate (TOPROL-XL) 50 MG 24 hr tablet Take 50 mg by mouth daily.  3  . triamterene-hydrochlorothiazide (MAXZIDE-25) 37.5-25 MG tablet Take by mouth daily. Pt  takes a half tablet  3  . valACYclovir (VALTREX) 1000 MG tablet Take 2 tablets (2000 mg) by mouth every 12 hours for 24 hours. 20 tablet 3   No current facility-administered medications for this visit.    Family History  Problem Relation Age of Onset  . Stroke Mother   . Prostate cancer Father   . Diabetes Sister   . Kidney cancer Paternal Grandmother     Review of Systems  Genitourinary: Positive for hematuria.  All other systems reviewed and are negative. Denies dysuria.   Exam:   BP 130/78   Pulse 69   Ht 5' (1.524 m)   Wt 151 lb (68.5 kg)   LMP 05/17/2007 (Approximate)   SpO2 99%   BMI 29.49 kg/m     General appearance: alert, cooperative and appears stated age Head: normocephalic, without obvious abnormality, atraumatic Neck: no adenopathy, supple, symmetrical, trachea midline and thyroid normal to inspection and palpation Lungs: clear to auscultation bilaterally Breasts: normal appearance, no masses or tenderness, No nipple retraction or dimpling, No nipple discharge or bleeding, No axillary adenopathy Heart: regular rate and rhythm Abdomen: soft, non-tender; no masses, no organomegaly Extremities: extremities normal, atraumatic, no cyanosis or edema Skin: skin color, texture, turgor normal. No rashes or lesions Lymph nodes: cervical, supraclavicular, and axillary nodes normal. Neurologic: grossly normal  Pelvic: External genitalia:  no lesions              No abnormal inguinal nodes palpated.              Urethra:  normal appearing urethra with no masses, tenderness or lesions              Bartholins and Skenes: normal                 Vagina: normal appearing vagina with normal color and discharge, no lesions              Cervix: no lesions.  Yellow discharge from cervical os.               Pap taken: No. Bimanual Exam:  Uterus:  normal size, contour, position, consistency, mobility, non-tender              Adnexa: no mass, fullness, tenderness               Rectal exam: Yes.  .  Confirms.              Anus:  normal sphincter tone, no lesions  Chaperone was present for exam.  Assessment:   Well woman visit with normal exam. Elevated prolactin and microadenoma.  Hx microscopic hematuria.  Vaginal itching.   Plan: Mammogram screening discussed. Self breast awareness reviewed. Pap and HR HPV as above. Guidelines for Calcium, Vitamin D, regular exercise program including cardiovascular and weight bearing exercise. Labs and prolactin check with PCP.  She will check her insurance benefits for Cologuard.  Urinalysis:  sg 1.002, pH 5.5, Trace blood, 0 -2 RBC, NS WBC, 0 - 5 squams, NS bacteria.  UC sent. Nuswab test for vaginitis.  Follow up annually and prn.

## 2020-10-06 NOTE — Patient Instructions (Signed)

## 2020-10-08 LAB — URINALYSIS, COMPLETE W/RFL CULTURE
Bacteria, UA: NONE SEEN /HPF
Bilirubin Urine: NEGATIVE
Glucose, UA: NEGATIVE
Hyaline Cast: NONE SEEN /LPF
Ketones, ur: NEGATIVE
Leukocyte Esterase: NEGATIVE
Nitrites, Initial: NEGATIVE
pH: 5.5 (ref 5.0–8.0)

## 2020-10-08 LAB — CERVICOVAGINAL ANCILLARY ONLY
Bacterial Vaginitis (gardnerella): NEGATIVE
Candida Glabrata: NEGATIVE
Candida Vaginitis: NEGATIVE
Comment: NEGATIVE
Comment: NEGATIVE
Comment: NEGATIVE
Comment: NEGATIVE
Trichomonas: NEGATIVE

## 2020-10-08 LAB — URINE CULTURE
MICRO NUMBER:: 11928052
Result:: NO GROWTH
SPECIMEN QUALITY:: ADEQUATE

## 2020-10-08 LAB — CULTURE INDICATED

## 2020-10-20 DIAGNOSIS — E229 Hyperfunction of pituitary gland, unspecified: Secondary | ICD-10-CM | POA: Diagnosis not present

## 2020-10-20 DIAGNOSIS — R000 Tachycardia, unspecified: Secondary | ICD-10-CM | POA: Diagnosis not present

## 2020-10-20 DIAGNOSIS — Z0000 Encounter for general adult medical examination without abnormal findings: Secondary | ICD-10-CM | POA: Diagnosis not present

## 2020-10-20 DIAGNOSIS — I5189 Other ill-defined heart diseases: Secondary | ICD-10-CM | POA: Diagnosis not present

## 2020-10-20 DIAGNOSIS — E559 Vitamin D deficiency, unspecified: Secondary | ICD-10-CM | POA: Diagnosis not present

## 2020-10-20 DIAGNOSIS — I10 Essential (primary) hypertension: Secondary | ICD-10-CM | POA: Diagnosis not present

## 2020-11-17 DIAGNOSIS — H43813 Vitreous degeneration, bilateral: Secondary | ICD-10-CM | POA: Diagnosis not present

## 2021-01-18 ENCOUNTER — Ambulatory Visit: Payer: Self-pay

## 2021-01-18 DIAGNOSIS — K118 Other diseases of salivary glands: Secondary | ICD-10-CM | POA: Diagnosis not present

## 2021-03-16 DIAGNOSIS — L821 Other seborrheic keratosis: Secondary | ICD-10-CM | POA: Diagnosis not present

## 2021-03-16 DIAGNOSIS — D225 Melanocytic nevi of trunk: Secondary | ICD-10-CM | POA: Diagnosis not present

## 2021-03-16 DIAGNOSIS — L812 Freckles: Secondary | ICD-10-CM | POA: Diagnosis not present

## 2021-03-16 DIAGNOSIS — L400 Psoriasis vulgaris: Secondary | ICD-10-CM | POA: Diagnosis not present

## 2021-03-19 DIAGNOSIS — Z23 Encounter for immunization: Secondary | ICD-10-CM | POA: Diagnosis not present

## 2021-04-05 ENCOUNTER — Encounter: Payer: Self-pay | Admitting: Obstetrics and Gynecology

## 2021-04-05 ENCOUNTER — Other Ambulatory Visit: Payer: Self-pay

## 2021-04-05 ENCOUNTER — Ambulatory Visit (INDEPENDENT_AMBULATORY_CARE_PROVIDER_SITE_OTHER): Payer: BC Managed Care – PPO | Admitting: Obstetrics and Gynecology

## 2021-04-05 VITALS — BP 136/80 | HR 76 | Resp 18

## 2021-04-05 DIAGNOSIS — N907 Vulvar cyst: Secondary | ICD-10-CM | POA: Diagnosis not present

## 2021-04-05 NOTE — Progress Notes (Signed)
GYNECOLOGY  VISIT   HPI: 60 y.o.   Married  Caucasian  female   G2P2001 with Patient's last menstrual period was 05/17/2007 (approximate).   here for possible bartholins cyst, reports it may have gone away due to sx being better.   Felt something on vulva 2 weeks ago that felt enlarged and became irritated. Looked like a pimple.  Was itching but not painful. Now symptoms essentially resolved.  No drainage.   She denies vaginal itching or discharge.   No fevers or flu-like symptoms.   Hx oral HSV.   No genital outbreak.   Did not try over the counter treatments.   Has psoriasis.  No new partner.   GYNECOLOGIC HISTORY: Patient's last menstrual period was 05/17/2007 (approximate). Contraception: PM Menopausal hormone therapy:  none Last mammogram:  03/24/20-birads 1 neg, scheduled for 05/2021.  Last pap smear:   03/05/18- WNL, HPV- neg         OB History     Gravida  2   Para  2   Term  2   Preterm  0   AB  0   Living  1      SAB      IAB      Ectopic      Multiple      Live Births  1              Patient Active Problem List   Diagnosis Date Noted   Essential hypertension 09/14/2017   Palpitations 09/14/2017   Atypical chest pain 68/34/1962   Diastolic dysfunction 22/97/9892   Hyperlipidemia 09/14/2017    Past Medical History:  Diagnosis Date   Elevated prolactin level    HSV-1 infection    Hypertension    Microadenoma     Past Surgical History:  Procedure Laterality Date   CESAREAN SECTION     x 2.    TUBAL LIGATION      Current Outpatient Medications  Medication Sig Dispense Refill   Cetirizine HCl 10 MG CAPS Take 1 tablet by mouth as directed.     Cholecalciferol (VITAMIN D3) 1000 units CAPS Take 1 capsule by mouth daily.     clobetasol (TEMOVATE) 0.05 % external solution SMARTSIG:1 Milliliter(s) Topical Every Night     metoprolol succinate (TOPROL-XL) 50 MG 24 hr tablet Take 50 mg by mouth daily.  3    triamterene-hydrochlorothiazide (MAXZIDE-25) 37.5-25 MG tablet Take by mouth daily. Pt takes a half tablet  3   valACYclovir (VALTREX) 1000 MG tablet Take 2 tablets (2000 mg) by mouth every 12 hours for 24 hours. 30 tablet 3   No current facility-administered medications for this visit.     ALLERGIES: Amoxicillin-pot clavulanate, Aspirin, Ibuprofen, Macrolides and ketolides, Phenothiazines, and Sulfa antibiotics  Family History  Problem Relation Age of Onset   Stroke Mother    Prostate cancer Father    Diabetes Sister    Kidney cancer Paternal Grandmother     Social History   Socioeconomic History   Marital status: Married    Spouse name: Not on file   Number of children: Not on file   Years of education: Not on file   Highest education level: Not on file  Occupational History   Not on file  Tobacco Use   Smoking status: Never   Smokeless tobacco: Never  Vaping Use   Vaping Use: Never used  Substance and Sexual Activity   Alcohol use: Yes    Alcohol/week: 4.0 standard drinks  Types: 4 Glasses of wine per week   Drug use: Never   Sexual activity: Yes    Birth control/protection: Post-menopausal, Surgical    Comment: tubal ligation  Other Topics Concern   Not on file  Social History Narrative   Not on file   Social Determinants of Health   Financial Resource Strain: Not on file  Food Insecurity: Not on file  Transportation Needs: Not on file  Physical Activity: Not on file  Stress: Not on file  Social Connections: Not on file  Intimate Partner Violence: Not on file    Review of Systems  See HPI.   PHYSICAL EXAMINATION:    BP 136/80 (BP Location: Right Arm, Patient Position: Sitting, Cuff Size: Normal)   Pulse 76   Resp 18   LMP 05/17/2007 (Approximate)     General appearance: alert, cooperative and appears stated age   Pelvic: External genitalia:  left labia majora with 4 mm superficial subcutaneous cyst.              Urethra:  normal appearing  urethra with no masses, tenderness or lesions              Bartholins and Skenes: normal              Chaperone was present for exam:  Lovena Le, CMA and Sharee Pimple, RN.  ASSESSMENT  Left vulvar epidermoid cyst.   No abscess formation.  No evidence of vulvar HSV.  PLAN  We discussed epidermoid cysts, their benign etiology and usual observational management.  [place] for benadryl cream or hydrocortisone cream for pruritis.  Avoid trying to drain the area, which can result in infection.  She will call if it becomes enlarged or painful.  FU prn.    An After Visit Summary was printed and given to the patient.  25 min  total time was spent for this patient encounter, including preparation, face-to-face counseling with the patient, coordination of care, and documentation of the encounter.

## 2021-04-05 NOTE — Patient Instructions (Signed)
Epidermoid Cyst An epidermoid cyst, also known as epidermal cyst, is a sac made of skin tissue. The sac contains a substance called keratin. Keratin is a protein that is normally secreted through the hair follicles. When keratin becomes trapped in the top layer of skin (epidermis), it can form an epidermoid cyst. Epidermoid cysts can be found anywhere on your body. These cysts are usually harmless (benign), and they may not cause symptoms unless they become inflamed or infected. What are the causes? This condition may be caused by: A blocked hair follicle. A hair that curls and re-enters the skin instead of growing straight out of the skin (ingrown hair). A blocked pore. Irritated skin. An injury to the skin. Certain conditions that are passed along from parent to child (inherited). Human papillomavirus (HPV). This happens rarely when cysts occur on the bottom of the feet. Long-term (chronic) sun damage to the skin. What increases the risk? The following factors may make you more likely to develop an epidermoid cyst: Having acne. Being female. Having an injury to the skin. Being past puberty. Having certain rare genetic disorders. What are the signs or symptoms? The only symptom of this condition may be a small, painless lump underneath the skin. When an epidermal cyst ruptures, it may become inflamed. True infection in cysts is rare. Symptoms may include: Redness. Inflammation. Tenderness. Warmth. Keratin draining from the cyst. Keratin is grayish-white, bad-smelling substance. Pus draining from the cyst. How is this diagnosed? This condition is diagnosed with a physical exam. In some cases, you may have a sample of tissue (biopsy) taken from your cyst to be examined under a microscope or tested for bacteria. You may be referred to a health care provider who specializes in skin care (dermatologist). How is this treated? If a cyst becomes inflamed, treatment may include: Opening and  draining the cyst, done by a health care provider. After draining, minor surgery to remove the rest of the cyst may be done. Taking antibiotic medicine. Having injections of medicines (steroids) that help to reduce inflammation. Having surgery to remove the cyst. Surgery may be done if the cyst: Becomes large. Bothers you. Has a chance of turning into cancer. Do not try to open a cyst yourself. Follow these instructions at home: Medicines If you were prescribed an antibiotic medicine, take it it as told by your health care provider. Do not stop using the antibiotic even if you start to feel better. Take over-the-counter and prescription medicines only as told by your health care provider. General instructions Keep the area around your cyst clean and dry. Wear loose, dry clothing. Avoid touching your cyst. Check your cyst every day for signs of infection. Check for: Redness, swelling, or pain. Fluid or blood. Warmth. Pus or a bad smell. Keep all follow-up visits. This is important. How is this prevented? Wear clean, dry, clothing. Avoid wearing tight clothing. Keep your skin clean and dry. Take showers or baths every day. Contact a health care provider if: Your cyst develops symptoms of infection. Your condition is not improving or is getting worse. You develop a cyst that looks different from other cysts you have had. You have a fever. Get help right away if: Redness spreads from the cyst into the surrounding area. Summary An epidermoid cyst is a sac made of skin tissue. These cysts are usually harmless (benign), and they may not cause symptoms unless they become inflamed. If a cyst becomes inflamed, treatment may include surgery to open and drain the cyst,  or to remove it. Treatment may also include medicines by mouth or through an injection. Take over-the-counter and prescription medicines only as told by your health care provider. If you were prescribed an antibiotic medicine,  take it as told by your health care provider. Do not stop using the antibiotic even if you start to feel better. Contact a health care provider if your condition is not improving or is getting worse. Keep all follow-up visits as told by your health care provider. This is important. This information is not intended to replace advice given to you by your health care provider. Make sure you discuss any questions you have with your health care provider. Document Revised: 08/07/2019 Document Reviewed: 08/07/2019 Elsevier Patient Education  [place].

## 2021-05-25 DIAGNOSIS — Z1231 Encounter for screening mammogram for malignant neoplasm of breast: Secondary | ICD-10-CM | POA: Diagnosis not present

## 2021-05-27 ENCOUNTER — Encounter: Payer: Self-pay | Admitting: Obstetrics and Gynecology

## 2021-09-03 DIAGNOSIS — D125 Benign neoplasm of sigmoid colon: Secondary | ICD-10-CM | POA: Diagnosis not present

## 2021-09-03 DIAGNOSIS — K5730 Diverticulosis of large intestine without perforation or abscess without bleeding: Secondary | ICD-10-CM | POA: Diagnosis not present

## 2021-09-03 DIAGNOSIS — Z1211 Encounter for screening for malignant neoplasm of colon: Secondary | ICD-10-CM | POA: Diagnosis not present

## 2021-09-03 DIAGNOSIS — K649 Unspecified hemorrhoids: Secondary | ICD-10-CM | POA: Diagnosis not present

## 2021-09-21 DIAGNOSIS — M5136 Other intervertebral disc degeneration, lumbar region: Secondary | ICD-10-CM | POA: Diagnosis not present

## 2021-12-16 ENCOUNTER — Other Ambulatory Visit: Payer: Self-pay | Admitting: Internal Medicine

## 2021-12-16 DIAGNOSIS — D352 Benign neoplasm of pituitary gland: Secondary | ICD-10-CM | POA: Diagnosis not present

## 2021-12-16 DIAGNOSIS — I10 Essential (primary) hypertension: Secondary | ICD-10-CM | POA: Diagnosis not present

## 2021-12-16 DIAGNOSIS — G459 Transient cerebral ischemic attack, unspecified: Secondary | ICD-10-CM | POA: Diagnosis not present

## 2021-12-16 DIAGNOSIS — Z13220 Encounter for screening for lipoid disorders: Secondary | ICD-10-CM | POA: Diagnosis not present

## 2021-12-16 DIAGNOSIS — H539 Unspecified visual disturbance: Secondary | ICD-10-CM | POA: Diagnosis not present

## 2021-12-17 ENCOUNTER — Ambulatory Visit
Admission: RE | Admit: 2021-12-17 | Discharge: 2021-12-17 | Disposition: A | Discharge status: Home / Self Care | Payer: BC Managed Care – PPO | Source: Ambulatory Visit | Attending: Internal Medicine | Admitting: Internal Medicine

## 2021-12-17 DIAGNOSIS — G459 Transient cerebral ischemic attack, unspecified: Secondary | ICD-10-CM

## 2021-12-17 DIAGNOSIS — R531 Weakness: Secondary | ICD-10-CM | POA: Diagnosis not present

## 2021-12-17 MED ORDER — GADOBENATE DIMEGLUMINE 529 MG/ML IV SOLN
14.0000 mL | Freq: Once | INTRAVENOUS | Status: AC | PRN
  Administered 2021-12-17: 14 mL via INTRAVENOUS

## 2021-12-22 DIAGNOSIS — H539 Unspecified visual disturbance: Secondary | ICD-10-CM | POA: Diagnosis not present

## 2021-12-22 DIAGNOSIS — I6521 Occlusion and stenosis of right carotid artery: Secondary | ICD-10-CM | POA: Diagnosis not present

## 2021-12-29 DIAGNOSIS — H43812 Vitreous degeneration, left eye: Secondary | ICD-10-CM | POA: Diagnosis not present

## 2022-01-04 DIAGNOSIS — E785 Hyperlipidemia, unspecified: Secondary | ICD-10-CM | POA: Diagnosis not present

## 2022-01-04 DIAGNOSIS — G459 Transient cerebral ischemic attack, unspecified: Secondary | ICD-10-CM | POA: Diagnosis not present

## 2022-01-04 DIAGNOSIS — I10 Essential (primary) hypertension: Secondary | ICD-10-CM | POA: Diagnosis not present

## 2022-01-04 DIAGNOSIS — Z0000 Encounter for general adult medical examination without abnormal findings: Secondary | ICD-10-CM | POA: Diagnosis not present

## 2022-02-09 DIAGNOSIS — I10 Essential (primary) hypertension: Secondary | ICD-10-CM | POA: Diagnosis not present

## 2022-02-09 DIAGNOSIS — K219 Gastro-esophageal reflux disease without esophagitis: Secondary | ICD-10-CM | POA: Diagnosis not present

## 2022-02-09 DIAGNOSIS — R002 Palpitations: Secondary | ICD-10-CM | POA: Diagnosis not present

## 2022-02-16 DIAGNOSIS — K2100 Gastro-esophageal reflux disease with esophagitis, without bleeding: Secondary | ICD-10-CM | POA: Diagnosis not present

## 2022-03-14 DIAGNOSIS — Z23 Encounter for immunization: Secondary | ICD-10-CM | POA: Diagnosis not present

## 2022-03-21 DIAGNOSIS — J309 Allergic rhinitis, unspecified: Secondary | ICD-10-CM | POA: Diagnosis not present

## 2022-03-21 DIAGNOSIS — T887XXA Unspecified adverse effect of drug or medicament, initial encounter: Secondary | ICD-10-CM | POA: Diagnosis not present

## 2022-03-21 DIAGNOSIS — H539 Unspecified visual disturbance: Secondary | ICD-10-CM | POA: Diagnosis not present

## 2022-03-21 DIAGNOSIS — R040 Epistaxis: Secondary | ICD-10-CM | POA: Diagnosis not present

## 2022-03-21 DIAGNOSIS — E229 Hyperfunction of pituitary gland, unspecified: Secondary | ICD-10-CM | POA: Diagnosis not present

## 2022-03-29 DIAGNOSIS — D1801 Hemangioma of skin and subcutaneous tissue: Secondary | ICD-10-CM | POA: Diagnosis not present

## 2022-03-29 DIAGNOSIS — L821 Other seborrheic keratosis: Secondary | ICD-10-CM | POA: Diagnosis not present

## 2022-03-29 DIAGNOSIS — D225 Melanocytic nevi of trunk: Secondary | ICD-10-CM | POA: Diagnosis not present

## 2022-03-29 DIAGNOSIS — D2372 Other benign neoplasm of skin of left lower limb, including hip: Secondary | ICD-10-CM | POA: Diagnosis not present

## 2022-03-29 DIAGNOSIS — L812 Freckles: Secondary | ICD-10-CM | POA: Diagnosis not present

## 2022-07-05 DIAGNOSIS — I10 Essential (primary) hypertension: Secondary | ICD-10-CM | POA: Diagnosis not present

## 2022-07-05 DIAGNOSIS — E785 Hyperlipidemia, unspecified: Secondary | ICD-10-CM | POA: Diagnosis not present

## 2022-07-13 DIAGNOSIS — Z1231 Encounter for screening mammogram for malignant neoplasm of breast: Secondary | ICD-10-CM | POA: Diagnosis not present

## 2022-07-14 ENCOUNTER — Encounter: Payer: Self-pay | Admitting: Obstetrics and Gynecology

## 2022-07-20 DIAGNOSIS — Z780 Asymptomatic menopausal state: Secondary | ICD-10-CM | POA: Diagnosis not present

## 2022-07-20 DIAGNOSIS — M85852 Other specified disorders of bone density and structure, left thigh: Secondary | ICD-10-CM | POA: Diagnosis not present

## 2022-07-20 DIAGNOSIS — M85851 Other specified disorders of bone density and structure, right thigh: Secondary | ICD-10-CM | POA: Diagnosis not present

## 2022-08-03 ENCOUNTER — Ambulatory Visit: Payer: BC Managed Care – PPO | Attending: Cardiovascular Disease | Admitting: Cardiovascular Disease

## 2022-08-03 ENCOUNTER — Encounter: Payer: Self-pay | Admitting: Cardiovascular Disease

## 2022-08-03 VITALS — BP 112/66 | HR 67 | Ht 60.0 in | Wt 152.0 lb

## 2022-08-03 DIAGNOSIS — E782 Mixed hyperlipidemia: Secondary | ICD-10-CM

## 2022-08-03 DIAGNOSIS — I10 Essential (primary) hypertension: Secondary | ICD-10-CM

## 2022-08-03 DIAGNOSIS — R0789 Other chest pain: Secondary | ICD-10-CM

## 2022-08-03 DIAGNOSIS — R002 Palpitations: Secondary | ICD-10-CM | POA: Diagnosis not present

## 2022-08-03 NOTE — Assessment & Plan Note (Signed)
History of essential hypertension blood pressure measured today at 112/66.  She is on metoprolol, triamterene and hydrochlorothiazide.

## 2022-08-03 NOTE — Assessment & Plan Note (Signed)
History of atypical chest pain in the past with a positive GXT and a coronary calcium score/CTA performed 08/09/2017 which was 0 with no evidence of's obstructive plaque.  She has had no further symptoms.

## 2022-08-03 NOTE — Assessment & Plan Note (Signed)
History of palpitations which occur approximately once a month that she attributes potentially to anxiety.  She does not drink caffeine.  I am going to do a 30-day event monitor to further evaluate.

## 2022-08-03 NOTE — Assessment & Plan Note (Signed)
History of heart lipidemia on atorvastatin 10 mg a day with lipid profile performed 07/05/2022 revealing total cholesterol 150, LDL 69 HDL 59.

## 2022-08-03 NOTE — Progress Notes (Signed)
08/03/2022 Dawn Duffy   12/04/1960  CQ:[id number]  Primary Physician Leeroy Cha, MD Primary Cardiologist: Lorretta Harp MD [name], [place]  HPI:  Dawn Duffy is a 62 y.o.   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 She  was originally referred by Dr. Inda Merlin for cardiac evaluation because of chest pain, palpitations and a recent GXT that was positive. Her only risk factor includes true hypertension which is fairly new over the last several months. She was under significant amount of stress at home however. She also has palpitations which began back in the first year along with occasional chest pressure and left upper extremity radiation. Recent 2-D echo showed normal LV function with grade 1 diastolic dysfunction GXT showed 1-2 mm of lateral ST segment depression.  I obtained a coronary CTA on her when I saw her last 02/20/2018 revealed a coronary calcium score 0 with no evidence of CAD.  She has had no recurrent symptoms.  Since I saw her 5 years ago she has noted some palpitations which she was having when I originally saw her as well.  These occur probably once a month, sometimes at night when she wakes up and on the way to work which makes her thinks that they may be related 20.  She does not drink caffeine.  She was begun on an antihypertensive medication as well as a statin drug by her PCP.   Current Meds  Medication Sig   atorvastatin (LIPITOR) 10 MG tablet Take 10 mg by mouth daily.   Cetirizine HCl 10 MG CAPS Take 1 tablet by mouth as directed.   Cholecalciferol (VITAMIN D3) 1000 units CAPS Take 1 capsule by mouth daily.   clobetasol (TEMOVATE) 0.05 % external solution SMARTSIG:1 Milliliter(s) Topical Every Night   metoprolol succinate (TOPROL-XL) 50 MG 24 hr tablet Take 50 mg by mouth in the morning and at bedtime.    triamterene-hydrochlorothiazide (MAXZIDE-25) 37.5-25 MG tablet Take by mouth daily. Pt takes a half tablet   valACYclovir (VALTREX) 1000 MG tablet Take 2 tablets (2000 mg) by mouth every 12 hours for 24 hours.     Allergies  Allergen Reactions   Amoxicillin-Pot Clavulanate Nausea Only and Other (See Comments)   Aspirin Other (See Comments)   Ibuprofen Nausea Only   Macrolides And Ketolides Other (See Comments)   Phenothiazines    Sulfa Antibiotics     Social History   Socioeconomic History   Marital status: Married    Spouse name: Not on file   Number of children: Not on file   Years of education: Not on file   Highest education level: Not on file  Occupational History   Not on file  Tobacco Use   Smoking status: Never   Smokeless tobacco: Never  Vaping Use   Vaping Use: Never used  Substance and Sexual Activity   Alcohol use: Yes    Alcohol/week: 4.0 standard drinks of alcohol    Types: 4 Glasses of wine per week   Drug use: Never   Sexual activity: Yes    Birth control/protection: Post-menopausal, Surgical    Comment: tubal ligation  Other Topics Concern   Not on file  Social History Narrative   Not on file   Social Determinants of Health   Financial Resource Strain: Not on file  Food Insecurity: Not on file  Transportation Needs: Not on  file  Physical Activity: Not on file  Stress: Not on file  Social Connections: Not on file  Intimate Partner Violence: Not on file     Review of Systems: General: negative for chills, fever, night sweats or weight changes.  Cardiovascular: negative for chest pain, dyspnea on exertion, edema, orthopnea, palpitations, paroxysmal nocturnal dyspnea or shortness of breath Dermatological: negative for rash Respiratory: negative for cough or wheezing Urologic: negative for hematuria Abdominal: negative for nausea, vomiting, diarrhea, bright red blood per rectum, melena, or hematemesis Neurologic: negative for visual changes,  syncope, or dizziness All other systems reviewed and are otherwise negative except as noted above.    Blood pressure 112/66, pulse 67, height 5' (1.524 m), weight 152 lb (68.9 kg), last menstrual period 05/17/2007.  General appearance: alert and no distress Neck: no adenopathy, no carotid bruit, no JVD, supple, symmetrical, trachea midline, and thyroid not enlarged, symmetric, no tenderness/mass/nodules Lungs: clear to auscultation bilaterally Heart: regular rate and rhythm, S1, S2 normal, no murmur, click, rub or gallop Extremities: extremities normal, atraumatic, no cyanosis or edema Pulses: 2+ and symmetric Skin: Skin color, texture, turgor normal. No rashes or lesions Neurologic: Grossly normal  EKG sinus rhythm at 67 with anterior T wave inversion unchanged from prior EKGs.  I personally reviewed this EKG.  ASSESSMENT AND PLAN:   Essential hypertension History of essential hypertension blood pressure measured today at 112/66.  She is on metoprolol, triamterene and hydrochlorothiazide.    Palpitations History of palpitations which occur approximately once a month that she attributes potentially to anxiety.  She does not drink caffeine.  I am going to do a 30-day event monitor to further evaluate.  Atypical chest pain History of atypical chest pain in the past with a positive GXT and a coronary calcium score/CTA performed 08/09/2017 which was 0 with no evidence of's obstructive plaque.  She has had no further symptoms.  Hyperlipidemia History of heart lipidemia on atorvastatin 10 mg a day with lipid profile performed 07/05/2022 revealing total cholesterol 150, LDL 69 HDL 59.     Lorretta Harp MD [facility], [facility] 08/03/2022 10:01 AM

## 2022-08-03 NOTE — Patient Instructions (Addendum)
Medication Instructions:  Your physician recommends that you continue on your current medications as directed. Please refer to the Current Medication list given to you today.  *If you need a refill on your cardiac medications before your next appointment, please call your pharmacy*   Testing/Procedures: [employer] Cardiac Event Monitor Instructions  Your physician has requested you wear your cardiac event monitor for 30 days. [employer] may call or text to confirm a shipping address. The monitor will be sent to a land address via UPS. [employer] will not ship a monitor to a PO BOX. It typically takes 3-5 days to receive your monitor after it has been enrolled. [employer] will assist with USPS tracking if your package is delayed. The telephone number for [employer] is [phone]. Once you have received your monitor, please review the enclosed instructions. Instruction tutorials can also be viewed under help and settings on the enclosed cell phone. Your monitor has already been registered assigning a specific monitor serial # to you.  Billing and Self Pay Discount Information  [employer] has been provided the insurance information we had on file for you.  If your insurance has been updated, please call [employer] at [phone] to provide them with your updated insurance information.   [employer] offers a discounted Self Pay option for patients who have insurance that does not cover their cardiac event monitor or patients without insurance.  The discounted cost of a Self Pay Cardiac Event Monitor would be $225.00 , if the patient contacts [employer] at [phone] within 7 days of applying the monitor to make payment arrangements.  If the patient does not contact [employer] within 7 days of applying the monitor, the cost of the cardiac event monitor will be $350.00.  Applying the monitor  Remove cell phone from case and turn it on. The cell phone works as Dealer and needs to be  within [employer] of you at all times. The cell phone will need to be charged on a daily basis. We recommend you plug the cell phone into the enclosed charger at your bedside table every night.  Monitor batteries: You will receive two monitor batteries labelled #1 and #2. These are your recorders. Plug battery #2 onto the second connection on the enclosed charger. Keep one battery on the charger at all times. This will keep the monitor battery deactivated. It will also keep it fully charged for when you need to switch your monitor batteries. A small light will be blinking on the battery emblem when it is charging. The light on the battery emblem will remain on when the battery is fully charged.  Open package of a Monitor strip. Insert battery #1 into black hood on strip and gently squeeze monitor battery onto connection as indicated in instruction booklet. Set aside while preparing skin.  Choose location for your strip, vertical or horizontal, as indicated in the instruction booklet. Shave to remove all hair from location. There cannot be any lotions, oils, powders, or colognes on skin where monitor is to be applied. Wipe skin clean with enclosed Saline wipe. Dry skin completely.  Peel paper labeled #1 off the back of the Monitor strip exposing the adhesive. Place the monitor on the chest in the vertical or horizontal position shown in the instruction booklet. One arrow on the monitor strip must be pointing upward. Carefully remove paper labeled #2, attaching remainder of strip to your skin. Try not to create any folds or wrinkles in the strip as you apply it.  Firmly press and  release the circle in the center of the monitor battery. You will hear a small beep. This is turning the monitor battery on. The heart emblem on the monitor battery will light up every 5 seconds if the monitor battery in turned on and connected to the patient securely. Do not push and hold the circle down as this turns  the monitor battery off. The cell phone will locate the monitor battery. A screen will appear on the cell phone checking the connection of your monitor strip. This may read poor connection initially but change to good connection within the next minute. Once your monitor accepts the connection you will hear a series of 3 beeps followed by a climbing crescendo of beeps. A screen will appear on the cell phone showing the two monitor strip placement options. Touch the picture that demonstrates where you applied the monitor strip.  Your monitor strip and battery are waterproof. You are able to shower, bathe, or swim with the monitor on. They just ask you do not submerge deeper than 3 feet underwater. We recommend removing the monitor if you are swimming in a lake, river, or ocean.  Your monitor battery will need to be switched to a fully charged monitor battery approximately once a week. The cell phone will alert you of an action which needs to be made.  On the cell phone, tap for details to reveal connection status, monitor battery status, and cell phone battery status. The green dots indicates your monitor is in good status. A red dot indicates there is something that needs your attention.  To record a symptom, click the circle on the monitor battery. In 30-60 seconds a list of symptoms will appear on the cell phone. Select your symptom and tap save. Your monitor will record a sustained or significant arrhythmia regardless of you clicking the button. Some patients do not feel the heart rhythm irregularities. [employer] will notify us of any serious or critical events.  Refer to instruction booklet for instructions on switching batteries, changing strips, the Do not disturb or Pause features, or any additional questions.  Call [employer] at [phone], to confirm your monitor is transmitting and record your baseline. They will answer any questions you may have regarding the monitor  instructions at that time.  Returning the monitor to [place] all equipment back into blue box. Peel off strip of paper to expose adhesive and close box securely. There is a prepaid UPS shipping label on this box. Drop in a UPS drop box, or at a UPS facility like [employer]. You may also contact [employer] to arrange UPS to pick up monitor package at your home.    Follow-Up: At [facility], you and your health needs are our priority.  As part of our continuing mission to provide you with exceptional heart care, we have created designated Provider Care Teams.  These Care Teams include your primary Cardiologist (physician) and Advanced Practice Providers (APPs -  Physician Assistants and Nurse Practitioners) who all work together to provide you with the care you need, when you need it.  We recommend signing up for the patient portal called "MyChart".  Sign up information is provided on this After Visit Summary.  MyChart is used to connect with patients for Virtual Visits (Telemedicine).  Patients are able to view lab/test results, encounter notes, upcoming appointments, etc.  Non-urgent messages can be sent to your provider as well.   To learn more about what you can do with MyChart, go  to [email or web address].    Your next appointment:   We will see you on an as needed basis.  Provider:   Quay Burow, MD

## 2022-10-18 ENCOUNTER — Ambulatory Visit: Payer: BC Managed Care – PPO | Admitting: Obstetrics and Gynecology

## 2023-01-05 DIAGNOSIS — M79645 Pain in left finger(s): Secondary | ICD-10-CM | POA: Diagnosis not present

## 2023-01-05 DIAGNOSIS — M25642 Stiffness of left hand, not elsewhere classified: Secondary | ICD-10-CM | POA: Diagnosis not present

## 2023-01-19 DIAGNOSIS — S66802A Unspecified injury of other specified muscles, fascia and tendons at wrist and hand level, left hand, initial encounter: Secondary | ICD-10-CM | POA: Diagnosis not present

## 2023-01-24 NOTE — Progress Notes (Signed)
62 y.o. G54P2001 Married Caucasian female here for annual exam.    Injured her left hand.  Wearing a brace.   Hx of possible TIA or migraine HA.   Takes Valtrex for oral HSV as needed. No hx genital HSV outbreaks.  Her PCP and dermatologist give her the Rx.  Mother has dementia and lives in [place].   PCP:  Lorenda Ishihara  Patient's last menstrual period was 05/17/2007 (approximate).           Sexually active: Yes.    The current method of family planning is post menopausal status.    Exercising: No.  The patient does not participate in regular exercise at present. Smoker:  no  Health Maintenance: Pap:  03/05/18 neg: HR HPV neg History of abnormal Pap:  no MMG:  07/13/22 Breast Density Cat B, BI-RADS CAT 1 neg Colonoscopy:  2022 or 2023 - Dr. Ewing Schlein, [place] [facility].  Polyps noted.  Due in 5 years.  BMD:   [facility] - 2024 TDaP:  PCP Gardasil:   no HIV: n/a Hep C: n/a Screening Labs:  Hb today: done with pcp , Urine today: not given    reports that she has never smoked. She has never used smokeless tobacco. She reports current alcohol use of about 4.0 standard drinks of alcohol per week. She reports that she does not use drugs.  Past Medical History:  Diagnosis Date   Elevated prolactin level    HSV-1 infection    Hypertension    Microadenoma     Past Surgical History:  Procedure Laterality Date   CESAREAN SECTION     x 2.    TUBAL LIGATION      Current Outpatient Medications  Medication Sig Dispense Refill   atorvastatin (LIPITOR) 10 MG tablet Take 10 mg by mouth daily.     Cetirizine HCl 10 MG CAPS Take 1 tablet by mouth as directed.     Cholecalciferol (VITAMIN D3) 1000 units CAPS Take 1 capsule by mouth daily.     clobetasol (TEMOVATE) 0.05 % external solution SMARTSIG:1 Milliliter(s) Topical Every Night     metoprolol succinate (TOPROL-XL) 50 MG 24 hr tablet Take 50 mg by mouth in the morning and at bedtime.  3   triamterene-hydrochlorothiazide (MAXZIDE-25)  37.5-25 MG tablet Take by mouth daily. Pt takes a half tablet  3   triamterene-hydrochlorothiazide (MAXZIDE-25) 37.5-25 MG tablet Take 0.5 tablets by mouth daily.     valACYclovir (VALTREX) 1000 MG tablet Take 2 tablets (2000 mg) by mouth every 12 hours for 24 hours. 30 tablet 3   No current facility-administered medications for this visit.    Family History  Problem Relation Age of Onset   Stroke Mother    Prostate cancer Father    Diabetes Sister    Kidney cancer Paternal Grandmother     Review of Systems  All other systems reviewed and are negative.   Exam:   BP 124/72   Pulse 88   Ht 5' (1.524 m)   Wt 157 lb 6.4 oz (71.4 kg)   LMP 05/17/2007 (Approximate)   SpO2 100%   BMI 30.74 kg/m     General appearance: alert, cooperative and appears stated age Head: normocephalic, without obvious abnormality, atraumatic Neck: no adenopathy, supple, symmetrical, trachea midline and thyroid normal to inspection and palpation Lungs: clear to auscultation bilaterally Breasts: normal appearance, no masses or tenderness, No nipple retraction or dimpling, No nipple discharge or bleeding, No axillary adenopathy Heart: regular rate and rhythm Abdomen: soft,  non-tender; no masses, no organomegaly Extremities: extremities normal, atraumatic, no cyanosis or edema Skin: skin color, texture, turgor normal. No rashes or lesions Lymph nodes: cervical, supraclavicular, and axillary nodes normal. Neurologic: grossly normal  Pelvic: External genitalia:  left labia majora ulcer 4 mm, tender.  HSV swab taken.               No abnormal inguinal nodes palpated.              Urethra:  normal appearing urethra with no masses, tenderness or lesions              Bartholins and Skenes: normal                 Vagina: normal appearing vagina with normal color and discharge, no lesions              Cervix: no lesions              Pap taken: yes Bimanual Exam:  Uterus:  normal size, contour, position,  consistency, mobility, non-tender              Adnexa: no mass, fullness, tenderness              Rectal exam: yes.  Confirms.              Anus:  normal sphincter tone, no lesions  Chaperone was present for exam:  Antony Salmon, CMA  Assessment:   Well woman visit with gynecologic exam. Hx HSV I.  Oral.  Vulvar ulcer.  Possible HSV.  Elevated prolactin and microadenoma.  PCP monitors this, which has been normal per patient.     Plan: Mammogram screening discussed. Self breast awareness reviewed. Pap and HR HPV collected. Guidelines for Calcium, Vitamin D, regular exercise program including cardiovascular and weight bearing exercise. HSV swab of the vulva.  She understands that Valtrex treats oral and genital HSV.  She declines to take Valtrex at this time.  Will get copy of her bone density done at [facility]. Follow up annually and prn.

## 2023-01-31 DIAGNOSIS — M25642 Stiffness of left hand, not elsewhere classified: Secondary | ICD-10-CM | POA: Diagnosis not present

## 2023-02-07 ENCOUNTER — Encounter: Payer: Self-pay | Admitting: Obstetrics and Gynecology

## 2023-02-07 ENCOUNTER — Other Ambulatory Visit (HOSPITAL_COMMUNITY)
Admission: RE | Admit: 2023-02-07 | Discharge: 2023-02-07 | Disposition: A | Discharge status: Home / Self Care | Payer: BC Managed Care – PPO | Source: Ambulatory Visit | Attending: Obstetrics and Gynecology | Admitting: Obstetrics and Gynecology

## 2023-02-07 ENCOUNTER — Ambulatory Visit (INDEPENDENT_AMBULATORY_CARE_PROVIDER_SITE_OTHER): Payer: BC Managed Care – PPO | Admitting: Obstetrics and Gynecology

## 2023-02-07 VITALS — BP 124/72 | HR 88 | Ht 60.0 in | Wt 157.4 lb

## 2023-02-07 DIAGNOSIS — Z124 Encounter for screening for malignant neoplasm of cervix: Secondary | ICD-10-CM | POA: Insufficient documentation

## 2023-02-07 DIAGNOSIS — N766 Ulceration of vulva: Secondary | ICD-10-CM | POA: Diagnosis not present

## 2023-02-07 DIAGNOSIS — Z01419 Encounter for gynecological examination (general) (routine) without abnormal findings: Secondary | ICD-10-CM

## 2023-02-07 NOTE — Patient Instructions (Signed)

## 2023-02-08 ENCOUNTER — Encounter: Payer: Self-pay | Admitting: Obstetrics and Gynecology

## 2023-02-09 LAB — CYTOLOGY - PAP
Comment: NEGATIVE
Diagnosis: NEGATIVE
High risk HPV: NEGATIVE

## 2023-02-09 LAB — SURESWAB HSV, TYPE 1/2 DNA, PCR
HSV 1 DNA: NOT DETECTED
HSV 2 DNA: NOT DETECTED

## 2023-02-10 ENCOUNTER — Encounter: Payer: Self-pay | Admitting: Obstetrics and Gynecology

## 2023-02-10 DIAGNOSIS — M25642 Stiffness of left hand, not elsewhere classified: Secondary | ICD-10-CM | POA: Diagnosis not present

## 2023-02-16 DIAGNOSIS — M25642 Stiffness of left hand, not elsewhere classified: Secondary | ICD-10-CM | POA: Diagnosis not present

## 2023-02-16 DIAGNOSIS — S66802A Unspecified injury of other specified muscles, fascia and tendons at wrist and hand level, left hand, initial encounter: Secondary | ICD-10-CM | POA: Diagnosis not present

## 2023-02-24 DIAGNOSIS — M25642 Stiffness of left hand, not elsewhere classified: Secondary | ICD-10-CM | POA: Diagnosis not present

## 2023-03-03 DIAGNOSIS — M25642 Stiffness of left hand, not elsewhere classified: Secondary | ICD-10-CM | POA: Diagnosis not present

## 2023-03-10 DIAGNOSIS — M25642 Stiffness of left hand, not elsewhere classified: Secondary | ICD-10-CM | POA: Diagnosis not present

## 2023-03-16 DIAGNOSIS — M25642 Stiffness of left hand, not elsewhere classified: Secondary | ICD-10-CM | POA: Diagnosis not present

## 2023-03-20 DIAGNOSIS — R5383 Other fatigue: Secondary | ICD-10-CM | POA: Diagnosis not present

## 2023-03-20 DIAGNOSIS — K529 Noninfective gastroenteritis and colitis, unspecified: Secondary | ICD-10-CM | POA: Diagnosis not present

## 2023-03-22 DIAGNOSIS — A051 Botulism food poisoning: Secondary | ICD-10-CM | POA: Diagnosis not present

## 2023-03-22 DIAGNOSIS — K529 Noninfective gastroenteritis and colitis, unspecified: Secondary | ICD-10-CM | POA: Diagnosis not present

## 2023-03-23 DIAGNOSIS — E229 Hyperfunction of pituitary gland, unspecified: Secondary | ICD-10-CM | POA: Diagnosis not present

## 2023-03-23 DIAGNOSIS — E559 Vitamin D deficiency, unspecified: Secondary | ICD-10-CM | POA: Diagnosis not present

## 2023-03-23 DIAGNOSIS — Z0000 Encounter for general adult medical examination without abnormal findings: Secondary | ICD-10-CM | POA: Diagnosis not present

## 2023-03-23 DIAGNOSIS — I10 Essential (primary) hypertension: Secondary | ICD-10-CM | POA: Diagnosis not present

## 2023-03-23 DIAGNOSIS — J309 Allergic rhinitis, unspecified: Secondary | ICD-10-CM | POA: Diagnosis not present

## 2023-03-29 DIAGNOSIS — M25642 Stiffness of left hand, not elsewhere classified: Secondary | ICD-10-CM | POA: Diagnosis not present

## 2023-03-30 DIAGNOSIS — S66802A Unspecified injury of other specified muscles, fascia and tendons at wrist and hand level, left hand, initial encounter: Secondary | ICD-10-CM | POA: Diagnosis not present

## 2023-04-03 DIAGNOSIS — Z23 Encounter for immunization: Secondary | ICD-10-CM | POA: Diagnosis not present

## 2023-05-15 DIAGNOSIS — R110 Nausea: Secondary | ICD-10-CM | POA: Diagnosis not present

## 2023-05-24 DIAGNOSIS — D2261 Melanocytic nevi of right upper limb, including shoulder: Secondary | ICD-10-CM | POA: Diagnosis not present

## 2023-05-24 DIAGNOSIS — L821 Other seborrheic keratosis: Secondary | ICD-10-CM | POA: Diagnosis not present

## 2023-05-24 DIAGNOSIS — D225 Melanocytic nevi of trunk: Secondary | ICD-10-CM | POA: Diagnosis not present

## 2023-05-24 DIAGNOSIS — L812 Freckles: Secondary | ICD-10-CM | POA: Diagnosis not present

## 2023-07-19 DIAGNOSIS — Z1231 Encounter for screening mammogram for malignant neoplasm of breast: Secondary | ICD-10-CM | POA: Diagnosis not present

## 2023-07-20 ENCOUNTER — Encounter: Payer: Self-pay | Admitting: Obstetrics and Gynecology

## 2023-07-21 ENCOUNTER — Other Ambulatory Visit (HOSPITAL_COMMUNITY): Payer: Self-pay | Admitting: Gastroenterology

## 2023-07-21 DIAGNOSIS — R110 Nausea: Secondary | ICD-10-CM | POA: Diagnosis not present

## 2023-07-21 DIAGNOSIS — K219 Gastro-esophageal reflux disease without esophagitis: Secondary | ICD-10-CM | POA: Diagnosis not present

## 2023-07-21 DIAGNOSIS — R1013 Epigastric pain: Secondary | ICD-10-CM | POA: Diagnosis not present

## 2023-08-11 ENCOUNTER — Ambulatory Visit (HOSPITAL_COMMUNITY)
Admission: RE | Admit: 2023-08-11 | Discharge: 2023-08-11 | Disposition: A | Discharge status: Home / Self Care | Source: Ambulatory Visit | Attending: Gastroenterology | Admitting: Gastroenterology

## 2023-08-11 DIAGNOSIS — R1013 Epigastric pain: Secondary | ICD-10-CM | POA: Insufficient documentation

## 2023-08-11 DIAGNOSIS — K219 Gastro-esophageal reflux disease without esophagitis: Secondary | ICD-10-CM | POA: Diagnosis not present

## 2023-08-11 DIAGNOSIS — R110 Nausea: Secondary | ICD-10-CM | POA: Diagnosis not present

## 2023-08-11 DIAGNOSIS — R1011 Right upper quadrant pain: Secondary | ICD-10-CM | POA: Diagnosis not present

## 2023-08-11 MED ORDER — TECHNETIUM TC 99M MEBROFENIN IV KIT
5.1000 | PACK | Freq: Once | INTRAVENOUS | Status: AC
  Administered 2023-08-11: 5.1 via INTRAVENOUS

## 2023-08-30 DIAGNOSIS — K219 Gastro-esophageal reflux disease without esophagitis: Secondary | ICD-10-CM | POA: Diagnosis not present

## 2023-08-30 DIAGNOSIS — R1013 Epigastric pain: Secondary | ICD-10-CM | POA: Diagnosis not present

## 2023-08-30 DIAGNOSIS — K317 Polyp of stomach and duodenum: Secondary | ICD-10-CM | POA: Diagnosis not present

## 2023-08-30 DIAGNOSIS — K449 Diaphragmatic hernia without obstruction or gangrene: Secondary | ICD-10-CM | POA: Diagnosis not present

## 2023-10-04 DIAGNOSIS — H43813 Vitreous degeneration, bilateral: Secondary | ICD-10-CM | POA: Diagnosis not present

## 2023-10-04 DIAGNOSIS — H524 Presbyopia: Secondary | ICD-10-CM | POA: Diagnosis not present

## 2023-10-04 DIAGNOSIS — H35372 Puckering of macula, left eye: Secondary | ICD-10-CM | POA: Diagnosis not present

## 2023-10-04 DIAGNOSIS — H52203 Unspecified astigmatism, bilateral: Secondary | ICD-10-CM | POA: Diagnosis not present

## 2023-10-04 DIAGNOSIS — H01004 Unspecified blepharitis left upper eyelid: Secondary | ICD-10-CM | POA: Diagnosis not present

## 2023-10-04 DIAGNOSIS — H2513 Age-related nuclear cataract, bilateral: Secondary | ICD-10-CM | POA: Diagnosis not present

## 2023-10-04 DIAGNOSIS — H0100A Unspecified blepharitis right eye, upper and lower eyelids: Secondary | ICD-10-CM | POA: Diagnosis not present

## 2024-01-07 DIAGNOSIS — B349 Viral infection, unspecified: Secondary | ICD-10-CM | POA: Diagnosis not present

## 2024-01-17 DIAGNOSIS — J40 Bronchitis, not specified as acute or chronic: Secondary | ICD-10-CM | POA: Diagnosis not present

## 2024-03-18 DIAGNOSIS — Z23 Encounter for immunization: Secondary | ICD-10-CM | POA: Diagnosis not present

## 2024-04-04 DIAGNOSIS — M25511 Pain in right shoulder: Secondary | ICD-10-CM | POA: Diagnosis not present

## 2024-06-05 ENCOUNTER — Other Ambulatory Visit (HOSPITAL_BASED_OUTPATIENT_CLINIC_OR_DEPARTMENT_OTHER): Payer: Self-pay

## 2024-08-14 ENCOUNTER — Ambulatory Visit: Admitting: Family Medicine
